# Patient Record
Sex: Female | Born: 1995
Health system: Southern US, Community
[De-identification: ages and names within clinical notes are randomized; demographics above are authoritative.]

## PROBLEM LIST (undated history)

## (undated) DIAGNOSIS — T7840XA Allergy, unspecified, initial encounter: Secondary | ICD-10-CM

## (undated) DIAGNOSIS — J45909 Unspecified asthma, uncomplicated: Secondary | ICD-10-CM

## (undated) DIAGNOSIS — K219 Gastro-esophageal reflux disease without esophagitis: Secondary | ICD-10-CM

## (undated) HISTORY — PX: NO PAST SURGERIES: SHX2092

## (undated) HISTORY — DX: Allergy, unspecified, initial encounter: T78.40XA

## (undated) HISTORY — DX: Unspecified asthma, uncomplicated: J45.909

## (undated) HISTORY — DX: Gastro-esophageal reflux disease without esophagitis: K21.9

---
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
## 2016-03-28 ENCOUNTER — Ambulatory Visit (INDEPENDENT_AMBULATORY_CARE_PROVIDER_SITE_OTHER): Payer: 59 | Admitting: Internal Medicine

## 2016-03-28 ENCOUNTER — Encounter: Payer: Self-pay | Admitting: Internal Medicine

## 2016-03-28 VITALS — BP 128/70 | HR 86 | Temp 98.0°F | Resp 16 | Ht 64.0 in | Wt 170.0 lb

## 2016-03-28 DIAGNOSIS — Z79899 Other long term (current) drug therapy: Secondary | ICD-10-CM | POA: Diagnosis not present

## 2016-03-28 DIAGNOSIS — L03032 Cellulitis of left toe: Secondary | ICD-10-CM

## 2016-03-28 DIAGNOSIS — E282 Polycystic ovarian syndrome: Secondary | ICD-10-CM

## 2016-03-28 LAB — CBC WITH DIFFERENTIAL/PLATELET
Basophils Absolute: 0 cells/uL (ref 0–200)
Basophils Relative: 0 %
EOS PCT: 1 %
Eosinophils Absolute: 75 cells/uL (ref 15–500)
HEMATOCRIT: 44.4 % (ref 35.0–45.0)
HEMOGLOBIN: 14.7 g/dL (ref 11.7–15.5)
LYMPHS ABS: 1500 {cells}/uL (ref 850–3900)
Lymphocytes Relative: 20 %
MCH: 30.1 pg (ref 27.0–33.0)
MCHC: 33.1 g/dL (ref 32.0–36.0)
MCV: 90.8 fL (ref 80.0–100.0)
MONO ABS: 375 {cells}/uL (ref 200–950)
MPV: 8.3 fL (ref 7.5–12.5)
Monocytes Relative: 5 %
NEUTROS ABS: 5550 {cells}/uL (ref 1500–7800)
Neutrophils Relative %: 74 %
Platelets: 294 10*3/uL (ref 140–400)
RBC: 4.89 MIL/uL (ref 3.80–5.10)
RDW: 12.9 % (ref 11.0–15.0)
WBC: 7.5 10*3/uL (ref 3.8–10.8)

## 2016-03-28 LAB — COMPREHENSIVE METABOLIC PANEL
ALBUMIN: 4.4 g/dL (ref 3.6–5.1)
ALK PHOS: 66 U/L (ref 47–176)
ALT: 12 U/L (ref 5–32)
AST: 15 U/L (ref 12–32)
BILIRUBIN TOTAL: 0.5 mg/dL (ref 0.2–1.1)
BUN: 9 mg/dL (ref 7–20)
CALCIUM: 9.8 mg/dL (ref 8.9–10.4)
CO2: 24 mmol/L (ref 20–31)
CREATININE: 0.81 mg/dL (ref 0.50–1.00)
Chloride: 106 mmol/L (ref 98–110)
Glucose, Bld: 83 mg/dL (ref 65–99)
Potassium: 4.5 mmol/L (ref 3.8–5.1)
SODIUM: 142 mmol/L (ref 135–146)
TOTAL PROTEIN: 7.4 g/dL (ref 6.3–8.2)

## 2016-03-28 LAB — TSH: TSH: 1.71 m[IU]/L (ref 0.50–4.30)

## 2016-03-28 NOTE — Progress Notes (Signed)
Subjective:    Patient ID: Katrina Baker, female    DOB: 1995-11-09, 20 y.o.   MRN: [id number]  HPI  Patient presents to the office for evaluation of toenail pain.  She reports that in April she stubbed her toe on a washing machine.  She reports that it split the nail and it split all the way down.  She reports that she did see a podiatrist and they took the nail off and then about a week ago she stubbed it again and got a small infection.  She was placed on keflex.  She has 3 days of the antibiotic left.  She reports it is getting better. She has no drainage.  She feels like there was some original swelling and redness.  This has improved some.    She reports that she does want to see somebody over at [facility] endocrinology for her PCOS.  She was told that they needed some labwork done here first.  They need a referral from [place] to get her set up.     Review of Systems  Constitutional: Negative for chills and fever.  HENT: Negative for congestion, ear pain and sore throat.   Eyes: Negative.   Respiratory: Negative for cough, shortness of breath and wheezing.   Cardiovascular: Negative for chest pain, palpitations and leg swelling.  Gastrointestinal: Negative for abdominal pain, blood in stool, constipation and diarrhea.  Genitourinary: Negative.   Skin: Positive for wound.  Neurological: Negative for dizziness and headaches.  Psychiatric/Behavioral: The patient is not nervous/anxious.        Objective:   Physical Exam  Constitutional: She is oriented to person, place, and time. She appears well-developed and well-nourished.  HENT:  Head: Normocephalic.  Mouth/Throat: Oropharynx is clear and moist. No oropharyngeal exudate.  Eyes: Conjunctivae and EOM are normal. Pupils are equal, round, and reactive to light. No scleral icterus.  Neck: Normal range of motion. Neck supple. No JVD present. No thyromegaly present.  Cardiovascular: Normal rate, regular rhythm and intact distal  pulses.  Exam reveals no gallop and no friction rub.   No murmur heard. Pulmonary/Chest: Effort normal and breath sounds normal. No respiratory distress. She has no wheezes. She has no rales. She exhibits no tenderness.  Abdominal: Soft. Bowel sounds are normal. She exhibits no distension and no mass. There is no tenderness. There is no rebound and no guarding.  Musculoskeletal: Normal range of motion.  Lymphadenopathy:    She has no cervical adenopathy.  Neurological: She is alert and oriented to person, place, and time. No cranial nerve deficit. Coordination normal.  Skin: Skin is warm and dry.  Left great toe with mild lateral redness at the edge of the nail with no subungal swelling or redness.  Non-tender to palpation.    Psychiatric: She has a normal mood and affect. Her behavior is normal. Judgment and thought content normal.  Nursing note and vitals reviewed.   Vitals:   03/28/16 1129  BP: 128/70  Pulse: 86  Resp: 16  Temp: 98 F (36.7 C)           Assessment & Plan:    1. PCOS (polycystic ovarian syndrome) -referral to endocrinology per obgyn recommendations - Hemoglobin [id number] - TSH - Follicle Stimulating Hormone - Luteinizing hormone - Prolactin - Estrogens, Total - Testosterone - Ambulatory referral to Endocrinology  2. Medication management  - CBC with Differential/Platelet - Comprehensive metabolic panel  3. Paronychia of great toe, left -well healing -warm water soaks -pull  skin back on nail edge -finish keflex

## 2016-03-29 LAB — TESTOSTERONE: Testosterone: 49 ng/dL

## 2016-03-29 LAB — HEMOGLOBIN A1C
Hgb A1c MFr Bld: 5 % (ref <5.7)
Mean Plasma Glucose: 97 mg/dL

## 2016-03-29 LAB — LUTEINIZING HORMONE: LH: 1.8 m[IU]/mL

## 2016-03-29 LAB — FOLLICLE STIMULATING HORMONE: FSH: 5.7 m[IU]/mL

## 2016-03-29 LAB — PROLACTIN: Prolactin: 5.5 ng/mL

## 2016-03-30 ENCOUNTER — Ambulatory Visit (INDEPENDENT_AMBULATORY_CARE_PROVIDER_SITE_OTHER): Payer: 59 | Admitting: Internal Medicine

## 2016-03-30 VITALS — BP 114/72 | HR 88 | Temp 98.2°F | Resp 16

## 2016-03-30 DIAGNOSIS — R55 Syncope and collapse: Secondary | ICD-10-CM | POA: Diagnosis not present

## 2016-03-30 NOTE — Patient Instructions (Signed)
Holter Monitoring A Holter monitor is a small device that is used to detect abnormal heart rhythms. It clips to your clothing and is connected by wires to flat, sticky disks (electrodes) that attach to your chest. It is worn continuously for 24-48 hours. HOME CARE INSTRUCTIONS  Wear your Holter monitor at all times, even while exercising and sleeping, for as long as directed by your health care provider.  Make sure that the Holter monitor is safely clipped to your clothing or close to your body as recommended by your health care provider.  Do not get the monitor or wires wet.  Do not put body lotion or moisturizer on your chest.  Keep your skin clean.  Keep a diary of your daily activities, such as walking and doing chores. If you feel that your heartbeat is abnormal or that your heart is fluttering or skipping a beat:  Record what you are doing when it happens.  Record what time of day the symptoms occur.  Return your Holter monitor as directed by your health care provider.  Keep all follow-up visits as directed by your health care provider. This is important. SEEK IMMEDIATE MEDICAL CARE IF:  You feel lightheaded or you faint.  You have trouble breathing.  You feel pain in your chest, upper arm, or jaw.  You feel sick to your stomach and your skin is pale, cool, or damp.  You heartbeat feels unusual or abnormal.   This information is not intended to replace advice given to you by your health care provider. Make sure you discuss any questions you have with your health care provider.   Document Released: 02/26/2004 Document Revised: 06/20/2014 Document Reviewed: 01/06/2014 Elsevier Interactive Patient Education 2016 [employer].  Syncope, commonly known as fainting, is a temporary loss of consciousness. It occurs when the blood flow to the brain is reduced. Vasovagal syncope (also called neurocardiogenic syncope) is a fainting spell in which the blood flow to the brain is  reduced because of a sudden drop in heart rate and blood pressure. Vasovagal syncope occurs when the brain and the cardiovascular system (blood vessels) do not adequately communicate and respond to each other. This is the most common cause of fainting. It often occurs in response to fear or some other type of emotional or physical stress. The body has a reaction in which the heart starts beating too slowly or the blood vessels expand, reducing blood pressure. This type of fainting spell is generally considered harmless. However, injuries can occur if a person takes a sudden fall during a fainting spell.  CAUSES  Vasovagal syncope occurs when a person's blood pressure and heart rate decrease suddenly, usually in response to a trigger. Many things and situations can trigger an episode. Some of these include:   Pain.   Fear.   The sight of blood or medical procedures, such as blood being drawn from a vein.   Common activities, such as coughing, swallowing, stretching, or going to the bathroom.   Emotional stress.   Prolonged standing, especially in a warm environment.   Lack of sleep or rest.   Prolonged lack of food.   Prolonged lack of fluids.   Recent illness.  The use of certain drugs that affect blood pressure, such as cocaine, alcohol, marijuana, inhalants, and opiates.  SYMPTOMS  Before the fainting episode, you may:   Feel dizzy or light headed.   Become pale.  Sense that you are going to faint.   Feel like the room  is spinning.   Have tunnel vision, only seeing directly in front of you.   Feel sick to your stomach (nauseous).   See spots or slowly lose vision.   Hear ringing in your ears.   Have a headache.   Feel warm and sweaty.   Feel a sensation of pins and needles. During the fainting spell, you will generally be unconscious for no longer than a couple minutes before waking up and returning to normal. If you get up too quickly before your  body can recover, you may faint again. Some twitching or jerky movements may occur during the fainting spell.  DIAGNOSIS  Your health care provider will ask about your symptoms, take a medical history, and perform a physical exam. Various tests may be done to rule out other causes of fainting. These may include blood tests and tests to check the heart, such as electrocardiography, echocardiography, and possibly an electrophysiology study. When other causes have been ruled out, a test may be done to check the body's response to changes in position (tilt table test). TREATMENT  Most cases of vasovagal syncope do not require treatment. Your health care provider may recommend ways to avoid fainting triggers and may provide home strategies for preventing fainting. If you must be exposed to a possible trigger, you can drink additional fluids to help reduce your chances of having an episode of vasovagal syncope. If you have warning signs of an oncoming episode, you can respond by positioning yourself favorably (lying down). If your fainting spells continue, you may be given medicines to prevent fainting. Some medicines may help make you more resistant to repeated episodes of vasovagal syncope. Special exercises or compression stockings may be recommended. In rare cases, the surgical placement of a pacemaker is considered. HOME CARE INSTRUCTIONS   Learn to identify the warning signs of vasovagal syncope.   Sit or lie down at the first warning sign of a fainting spell. If sitting, put your head down between your legs. If you lie down, swing your legs up in the air to increase blood flow to the brain.   Avoid hot tubs and saunas.  Avoid prolonged standing.  Drink enough fluids to keep your urine clear or pale yellow. Avoid caffeine.  Increase salt in your diet as directed by your health care provider.   If you have to stand for a long time, perform movements such as:   Crossing your legs.    Flexing and stretching your leg muscles.   Squatting.   Moving your legs.   Bending over.   Only take over-the-counter or prescription medicines as directed by your health care provider. Do not suddenly stop any medicines without asking your health care provider first. [place] IF:   Your fainting spells continue or happen more frequently in spite of treatment.   You lose consciousness for more than a couple minutes.  You have fainting spells during or after exercising or after being startled.   You have new symptoms that occur with the fainting spells, such as:   Shortness of breath.  Chest pain.   Irregular heartbeat.   You have episodes of twitching or jerky movements that last longer than a few seconds.  You have episodes of twitching or jerky movements without obvious fainting. SEEK IMMEDIATE MEDICAL CARE IF:   You have injuries or bleeding after a fainting spell.   You have episodes of twitching or jerky movements that last longer than 5 minutes.   You have  more than one spell of twitching or jerky movements before returning to consciousness after fainting.   This information is not intended to replace advice given to you by your health care provider. Make sure you discuss any questions you have with your health care provider.   Document Released: 05/16/2012 Document Revised: 10/14/2014 Document Reviewed: 05/16/2012 Elsevier Interactive Patient Education [employer].

## 2016-03-30 NOTE — Progress Notes (Signed)
Subjective:    Patient ID: Katrina Baker, female    DOB: [birth year], 20 y.o.   MRN: [id number]  HPI  Patient presents to the office for evaluation of a syncopal episode that occurred today at work.  Patient reports that this happened today when she was at work.  She reports that she was holding up a resident and she developed some lightheadedness.  She then squatted down.  She reports that this was witnessed by an LPN and was told that she had her eyes roll back and she fell backward.  She reports that she was out for 10 seconds.  The nurses checked her vitals sitting and standing and blood sugar too and her sugar was 94.  She reports that she was holding the patient for approximately 10 minutes.  She did not have knees locked out when she was standing.  She has passed out several times before.  She reports that this happened several years ago. She was told that it was a pain induced seizure.  She reports that she has seen a pediatric cardiologist.  She reports that she felt fine this morning.  She ate breakfast did her normal morning routine.    Review of Systems  Constitutional: Negative for chills and fever.  HENT: Negative for congestion, ear pain, postnasal drip and sore throat.   Eyes: Positive for visual disturbance (Blurry vision).  Respiratory: Negative for chest tightness and shortness of breath.   Cardiovascular: Negative for chest pain, palpitations and leg swelling.  Gastrointestinal: Negative for abdominal pain, blood in stool, constipation, diarrhea, nausea and vomiting.  Neurological: Positive for light-headedness and headaches. Negative for dizziness, weakness and numbness.  Psychiatric/Behavioral: Negative for agitation, decreased concentration and suicidal ideas. The patient is not nervous/anxious.        Objective:   Physical Exam  Constitutional: She is oriented to person, place, and time. She appears well-developed and well-nourished. No distress.  HENT:  Head:  Normocephalic.  Mouth/Throat: Oropharynx is clear and moist. No oropharyngeal exudate.  Small palpable occipital hematoma without laceration or bleeding.  Mildly tender to palpation.    Eyes: Conjunctivae and EOM are normal. Pupils are equal, round, and reactive to light. No scleral icterus.  Neck: Normal range of motion. Neck supple. No JVD present. No thyromegaly present.  Cardiovascular: Normal rate, regular rhythm, normal heart sounds and intact distal pulses.  Exam reveals no gallop and no friction rub.   No murmur heard. Pulmonary/Chest: Effort normal and breath sounds normal. No respiratory distress. She has no wheezes. She has no rales. She exhibits no tenderness.  Abdominal: Soft. Bowel sounds are normal. She exhibits no distension and no mass. There is no tenderness. There is no rebound and no guarding.  Musculoskeletal: Normal range of motion.       Cervical back: Normal.       Thoracic back: Normal.       Lumbar back: Normal.  Lymphadenopathy:    She has no cervical adenopathy.  Neurological: She is alert and oriented to person, place, and time. No cranial nerve deficit. Coordination normal.  Skin: Skin is warm and dry. She is not diaphoretic.  Psychiatric: She has a normal mood and affect. Her behavior is normal. Judgment and thought content normal.  Nursing note and vitals reviewed.   Vitals:   03/30/16 1122  BP: 114/72  Pulse: 88  Resp: 16  Temp: 98.2 F (36.8 C)          Assessment & Plan:  1. Syncope and collapse -EKG normal but no prior to compare to.  No delta waves, no ST changes, no evidence of arrhythmia -there is family history of POTs and will refer patient to see cards for possible holter monitor.  She has had normal echo in the past.   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  Patient appears clinically well hydrated -no further testing will be done today -work note given -patient to go to ER if chest pains, palpitations or any other concerning symptoms.  She and mother both agree to this.   - Ambulatory referral to Cardiology

## 2016-03-31 LAB — ESTROGENS, TOTAL: Estrogen: 129.4 pg/mL

## 2016-04-02 NOTE — Addendum Note (Signed)
Addended by: Charie Pinkus A on: 04/02/2016 06:21 PM   Modules accepted: Orders

## 2016-05-30 ENCOUNTER — Ambulatory Visit: Payer: PRIVATE HEALTH INSURANCE | Admitting: Cardiovascular Disease

## 2016-05-30 ENCOUNTER — Ambulatory Visit (INDEPENDENT_AMBULATORY_CARE_PROVIDER_SITE_OTHER): Payer: PRIVATE HEALTH INSURANCE | Admitting: Internal Medicine

## 2016-05-30 ENCOUNTER — Encounter: Payer: Self-pay | Admitting: Internal Medicine

## 2016-05-30 DIAGNOSIS — E282 Polycystic ovarian syndrome: Secondary | ICD-10-CM | POA: Diagnosis not present

## 2016-05-30 MED ORDER — METFORMIN HCL 500 MG PO TABS: 500.0000 mg | ORAL_TABLET | Freq: Two times a day (BID) | ORAL | 3 refills | Status: DC

## 2016-05-30 MED ORDER — SPIRONOLACTONE 50 MG PO TABS: 50.0000 mg | ORAL_TABLET | Freq: Two times a day (BID) | ORAL | 11 refills | Status: DC

## 2016-05-30 NOTE — Patient Instructions (Addendum)
Please continue your birth control pill.  Start Spironolactone 25 mg 2x a day and increase to 50 mg 2x a day after ~3 days. Come back for a nurse appt and labs in 1 week.  After next set of labs, start Metformin 500 mg 1x a day (with dinner) for 4 days, then increase to 500 mg 2x a day.  Please look up the following website: [email or web address]  Please see Ozzie Hoyle (nutritionist).   Polycystic Ovarian Syndrome Polycystic ovarian syndrome (PCOS) is a common hormonal disorder among women of reproductive age. In most women with PCOS, many small fluid-filled sacs (cysts) grow on the ovaries, and the cysts are not part of a normal menstrual cycle. PCOS can cause problems with your menstrual periods and make it difficult to get pregnant. It can also cause an increased risk of miscarriage with pregnancy. If it is not treated, PCOS can lead to serious health problems, such as diabetes and heart disease. What are the causes? The cause of PCOS is not known, but it may be the result of a combination of certain factors, such as:  Irregular menstrual cycle.  High levels of certain hormones (androgens).  Problems with the hormone that helps to control blood sugar (insulin resistance).  Certain genes. What increases the risk? This condition is more likely to develop in women who have a family history of PCOS. What are the signs or symptoms? Symptoms of PCOS may include:  Multiple ovarian cysts.  Infrequent periods or no periods.  Periods that are too frequent or too heavy.  Unpredictable periods.  Inability to get pregnant (infertility) because of not ovulating.  Increased growth of hair on the face, chest, stomach, back, thumbs, thighs, or toes.  Acne or oily skin. Acne may develop during adulthood, and it may not respond to treatment.  Pelvic pain.  Weight gain or obesity.  Patches of thickened and dark brown or black skin on the neck, arms, breasts, or thighs (acanthosis  nigricans).  Excess hair growth on the face, chest, abdomen, or upper thighs (hirsutism). How is this diagnosed? This condition is diagnosed based on:  Your medical history.  A physical exam, including a pelvic exam. Your health care provider may look for areas of increased hair growth on your skin.  Tests, such as:  Ultrasound. This may be used to examine the ovaries and the lining of the uterus (endometrium) for cysts.  Blood tests. These may be used to check levels of sugar (glucose), female hormone (testosterone), and female hormones (estrogen and progesterone) in your blood. How is this treated? There is no cure for PCOS, but treatment can help to manage symptoms and prevent more health problems from developing. Treatment varies depending on:  Your symptoms.  Whether you want to have a baby or whether you need birth control (contraception). Treatment may include nutrition and lifestyle changes along with:  Progesterone hormone to start a menstrual period.  Birth control pills to help you have regular menstrual periods.  Medicines to make you ovulate, if you want to get pregnant.  Medicine to reduce excessive hair growth.  Surgery, in severe cases. This may involve making small holes in one or both of your ovaries. This decreases the amount of testosterone that your body produces. Follow these instructions at home:  Take over-the-counter and prescription medicines only as told by your health care provider.  Follow a healthy meal plan. This can help you reduce the effects of PCOS.  Eat a healthy diet that includes lean  proteins, complex carbohydrates, fresh fruits and vegetables, low-fat dairy products, and healthy fats. Make sure to eat enough fiber.  If you are overweight, lose weight as told by your health care provider.  Losing 10% of your body weight may improve symptoms.  Your health care provider can determine how much weight loss is best for you and can help you  lose weight safely.  Keep all follow-up visits as told by your health care provider. This is important. Contact a health care provider if:  Your symptoms do not get better with medicine.  You develop new symptoms. This information is not intended to replace advice given to you by your health care provider. Make sure you discuss any questions you have with your health care provider. Document Released: 09/23/2004 Document Revised: 01/26/2016 Document Reviewed: 11/15/2015 Elsevier Interactive Patient Education  2017 [employer].

## 2016-05-30 NOTE — Progress Notes (Signed)
Patient ID: Katrina Baker, female   DOB: 03-Mar-1996, 20 y.o.   MRN: [id number]   HPI: Katrina Baker is a 20 y.o. female, referred by Dr. Melford Aase, for management of PCOS. She is here with her mother who offers part of the history.  Fertility/Menstrual cycles: - menarche at 20 y/o - irregular menses since menarche - dx'ed with PCOS at 20 y/o >> started OCP - then changed to [facility] >> tolerates this well - no h/o ovarian cysts - children: 0 - miscarriages: 0 - contraception: OCPs  Acne: - had acne, but now on topical creams - Retin-A + another component - only occasional breakouts  Hirsutism: - on her body: back of neck, back, chest, stomach - her biggest concern is the upper back, however, her mother tells me that she was born with a whirl of hair on the upper back and it never really disappeared  Weight gain: - she can lose 8-10 lbs then gain them back - no steroid use - no weight loss meds - Meals: - Breakfast: fruit, oatmeal or cereal - Lunch: meat + veggies + bread + fruit - Dinner: meat + veggies + bread + fruit - Snacks: rare Drinks: sodas - milk - Diets tried: cut out carbs - Exercise: cardio 30 min - 1h, strength training - 3-5x a week  Treatments tried: - did not try Metformin - did not try Spironolactone - did not try Vaniqua - on OCPs  - last testosterone level: 03/28/2016: Normal total testosterone, while on OCPs.  - Last thyroid tests: Lab Results  Component Value Date   TSH 1.71 03/28/2016   - Last HbA1c: Lab Results  Component Value Date   HGBA1C 5.0 03/28/2016   She has FH of PCOS in paternal aunt and cousins.  She has FH of DM.  She had several instances of passing out - every 3 years. She will be investigated by cardiology.  ROS: Constitutional: + weight gain, no fatigue, no subjective hyperthermia/hypothermia Eyes: no blurry vision, no xerophthalmia ENT: no sore throat, no nodules palpated in throat, no dysphagia/odynophagia, no  hoarseness Cardiovascular: no CP/SOB/palpitations/leg swelling Respiratory: no cough/SOB Gastrointestinal: no N/V/D/C Musculoskeletal: no muscle/joint aches Skin: + acne, + hair on face, upper back, abdomen Neurological: no tremors/numbness/tingling/dizziness Psychiatric: no depression/anxiety  Past Medical History:  Diagnosis Date  . Allergy    No past surgical history on file. Social History   Social History  . Marital status: Single    Spouse name: N/A  . Number of children: 0   Occupational History  . 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 Smoking status: Never Smoker  . Smokeless tobacco: Not on file  . Alcohol use No  . Drug use: No  . Sexual activity: Not Currently    Birth control/ protection: Pill   Current Outpatient Prescriptions on File Prior to Visit  Medication Sig Dispense Refill  . Levonorgestrel-Ethinyl Estrad (PORTIA-28 PO) Take by mouth daily.    . naproxen (NAPROSYN) 500 MG tablet Take 500 mg by mouth daily as needed.    . tretinoin (RETIN-A) 0.05 % cream Apply topically at bedtime. Apply pea sized amount at bedtime every other day. 45 g 0   No current facility-administered medications on file prior to visit.    No Known Allergies Family History  Problem Relation Age of Onset  . Cancer Paternal Grandmother   . Cancer Maternal Aunt   . Heart  Problems Mother   . Diabetes Father   . High blood pressure Father   . High Cholesterol Father    PE: BP 124/74   Pulse 86   Ht 5\' 4"  (1.626 m)   Wt 167 lb 9.6 oz (76 kg)   SpO2 98%   BMI 28.77 kg/m  Wt Readings from Last 3 Encounters:  05/30/16 167 lb 9.6 oz (76 kg) (91 %, Z= 1.36)*  03/28/16 170 lb (77.1 kg) (92 %, Z= 1.42)*  09/23/15 173 lb (78.5 kg) (94 %, Z= 1.52)*   * Growth percentiles are based on CDC 2-20 Years data.   Constitutional: overweight, in NAD, no full supraclavicular fat pads Eyes: PERRLA, EOMI, no exophthalmos ENT: moist mucous  membranes, no thyromegaly, no cervical lymphadenopathy Cardiovascular: RRR, No MRG Respiratory: CTA B Gastrointestinal: abdomen soft, NT, ND, BS+ Musculoskeletal: no deformities, strength intact in all 4 Skin: moist, warm; + acne on Anterior Neck, No dark terminal hair on chin, no vellum on sideburns, no skin tags, no acanthosis nigricans, no purple, wide, stretch marks. She does have lanugo on upper back Neurological: no tremor with outstretched hands, DTR normal in all 4  ASSESSMENT: 1. PCOS  PLAN: 1.  I had a long discussion with the patient and her mother about the fact that the PCOS is a misnomer, a patient does not necessarily have to have polycystic ovaries to be diagnosed with the disorder. This is of sum of several conditions, including:  weight gain  insulin resistance (and therefore a higher risk of developing diabetes later in life)  acne  hirsutism  irregular menstrual cycles  decreased fertility. - We also discussed about the fact that the treatment is usually targeted to addressing the problem that concerns the patient the most: acne/hirsutism, weight gain, or fertility, but there is no single treatment for PCOS.  - The first-line therapy are oral contraceptives. If she is concerned with her weight, we can use metformin; if she is concerned about acne/hirsutism, we can add spironolactone; and if she is concerned about fertility, I could refer her to reproductive endocrinology for possible use of clomiphene. - She is already on OCPs, which have greatly help with her menstrual irregularity. She feels well on them and her recent testosterone level was normal. She is bothered by her unwanted hair, especially on her back (however, per mom's description, this appears to be unrelated to PCOS), but also on abdomen. She is also bothered by her weight, and especially the difficulty keeping the weight off. - We discussed about adding spironolactone to help with the unwanted hair and  we will start at the low dose, 25 mg twice a day and advance to 50 mg twice a day in 3 days. I will then have the patient comfort nurse appointment for blood pressure check and a potassium check in a week. - Will also add metformin at 500 mg daily and increase to 500 mg twice a day. I explained that this diabetes medication is used off label for PCOS for weight control. We reviewed together her latest HbA1c, which is great, at 5.0%. I will repeat this in next visit. I also suggested a referral to nutrition, with which she agrees. - At next visit, she will also need a CMP and lipid panel, if not drawn by then - I'll see the patient back in 6 months  Philemon Kingdom, MD PhD [facility] Endocrinology

## 2016-05-31 ENCOUNTER — Encounter: Payer: Self-pay | Admitting: Internal Medicine

## 2016-06-08 ENCOUNTER — Other Ambulatory Visit: Payer: Self-pay

## 2016-06-16 ENCOUNTER — Ambulatory Visit (INDEPENDENT_AMBULATORY_CARE_PROVIDER_SITE_OTHER): Payer: 59 | Admitting: Cardiovascular Disease

## 2016-06-16 ENCOUNTER — Encounter: Payer: Self-pay | Admitting: Cardiovascular Disease

## 2016-06-16 VITALS — BP 100/58 | HR 82 | Ht 64.0 in | Wt 168.0 lb

## 2016-06-16 DIAGNOSIS — R55 Syncope and collapse: Secondary | ICD-10-CM | POA: Diagnosis not present

## 2016-06-16 HISTORY — DX: Syncope and collapse: R55

## 2016-06-16 NOTE — Progress Notes (Signed)
Cardiology Consultation Note    Date:  06/16/2016   ID:  BERENIZE BARTHA, DOB 09-15-1995, MRN [id number]  PCP:  Alesia Richards, MD  Cardiologist:   Sanda Klein, MD   Reason for consultation   Patient presents with  . Recurrent syncope, sharp bilateral chest pain        History of Present Illness:  Katrina Baker is a 21 y.o. female without known structural cardiac disease, here for evaluation of 4-5 episodes of syncope that have occurred roughly once every 3 years since middle school. The episodes have a peculiar pattern of always happening in the late fall.  All the episodes have a similar pattern. They all have a fairly lengthy prodrome with blurry vision and mild disorientation. In childhood they were consistently triggered by pain, for example after falling. The most recent one which happened in October occurred when she had been standing immobile for about 30-40 minutes, supporting a nursing home resident. Usually she is unconscious for about a minute, at most 2 or 3 minutes. During one episode her mother witnessed her stiffening and making some unusual guttural sounds. True convulsions have not been witnessed. She has not lost sphincter tone control. She is quickly reoriented after regaining consciousness. She reports undergoing neurological evaluation including a sleep deprived EEG with negative results. On one occasion she did have a minor scalp laceration after falling, but usually her syncopal events have not been associated with true injury. She denies palpitations.  Outside of the episodes of syncope she feels well and denies angina or dyspnea, palpitations, edema, claudication, focal neurological complaints. She has occasional sharp twinges on either side of her chest which sound convincingly musculoskeletal She sees an endocrinologist for PCOS and secondary hirsutism/acne problems. She does not have diabetes mellitus. She tends to have a low normal blood  pressure.  The mother has been diagnosed with POTS. Her father has systemic hypertension, which he developed in his 49s. Otherwise there is no significant past medical history.   Past Medical History:  Diagnosis Date  . Allergy     No past surgical history on file.  Current Medications: Outpatient Medications Prior to Visit  Medication Sig Dispense Refill  . Levonorgestrel-Ethinyl Estrad (PORTIA-28 PO) Take by mouth daily.    . metFORMIN (GLUCOPHAGE) 500 MG tablet Take 1 tablet (500 mg total) by mouth 2 (two) times daily with a meal. 180 tablet 3  . naproxen (NAPROSYN) 500 MG tablet Take 500 mg by mouth daily as needed.    . tretinoin (RETIN-A) 0.05 % cream Apply topically at bedtime. Apply pea sized amount at bedtime every other day. 45 g 0  . spironolactone (ALDACTONE) 50 MG tablet Take 1 tablet (50 mg total) by mouth 2 (two) times daily. (Patient not taking: Reported on 06/16/2016) 60 tablet 11   No facility-administered medications prior to visit.      Allergies:   Patient has no known allergies.   Social History   Social History  . Marital status: Single    Spouse name: N/A  . Number of children: N/A  . Years of education: N/A   Social History Main Topics  . Smoking status: Never Smoker  . Smokeless tobacco: None  . Alcohol use No  . Drug use: No  . Sexual activity: Not Currently    Birth control/ protection: Pill   Other Topics Concern  . None   Social History Narrative  . None     Family History:  The patient's family  history includes COPD in her maternal grandmother; Cancer in her maternal aunt and paternal grandmother; Diabetes in her father and maternal grandfather; Heart Problems in her mother; High Cholesterol in her father; High blood pressure in her father; Hypertension in her maternal grandfather.   ROS:   Please see the history of present illness.    ROS All other systems reviewed and are negative.   PHYSICAL EXAM:   VS:  BP (!) 100/58 (BP  Location: Left Arm, Patient Position: Sitting, Cuff Size: Normal)   Pulse 82   Ht 5\' 4"  (1.626 m)   Wt 168 lb (76.2 kg)   BMI 28.84 kg/m    GEN: Well nourished, well developed, in no acute distress  HEENT: normal  Neck: no JVD, carotid bruits, or masses Cardiac: RRR; no murmurs, rubs, or gallops,no edema  Respiratory:  clear to auscultation bilaterally, normal work of breathing GI: soft, nontender, nondistended, + BS MS: no deformity or atrophy  Skin: warm and dry, no rash Neuro:  Alert and Oriented x 3, Strength and sensation are intact Psych: euthymic mood, full affect  Wt Readings from Last 3 Encounters:  06/16/16 168 lb (76.2 kg) (91 %, Z= 1.37)*  05/30/16 167 lb 9.6 oz (76 kg) (91 %, Z= 1.36)*  03/28/16 170 lb (77.1 kg) (92 %, Z= 1.42)*   * Growth percentiles are based on CDC 2-20 Years data.      Studies/Labs Reviewed:   EKG:  EKG is ordered today.  The ekg ordered today demonstrates NSR, normal tracing. Is no evidence of preexcitation. The QT interval is normal. There are no repolarization abnormalities.  Recent Labs: 03/28/2016: ALT 12; BUN 9; Creat 0.81; Hemoglobin 14.7; Platelets 294; Potassium 4.5; Sodium 142; TSH 1.71    Additional studies/ records that were reviewed today include:  ER records 2012 and ECG from March 28, 2016    ASSESSMENT:    1. Syncope, unspecified syncope type      PLAN:  In order of problems listed above:  1. Syncope: Her episodes of loss of consciousness are highly suggestive of new early mediated syncope. She has a significant and repetitive prodrome and quick recovery. We'll perform an echocardiogram to make sure there are no underlying structural cardiac abnormalities, but I strongly doubt arrhythmic syncope based on her history. Discussed the importance of avoiding known triggers, always staying well hydrated, eating a relatively high salt diet, avoiding natural diuretics such as caffeine and alcohol, avoiding prolonged  orthostasis without moving, promptly assuming a supine position at the onset of prodromal symptoms. Reviewed the generally benign natural history of this disorder, also the fact that it is likely to be a lifelong condition. We'll call her with the results of the echo.    Medication Adjustments/Labs and Tests Ordered: Current medicines are reviewed at length with the patient today.  Concerns regarding medicines are outlined above.  Medication changes, Labs and Tests ordered today are listed in the Patient Instructions below. Patient Instructions  Neurally Mediated Syncope  Your physician has requested that you have an echocardiogram. Echocardiography is a painless test that uses sound waves to create images of your heart. It provides your doctor with information about the size and shape of your heart and how well your heart's chambers and valves are working. This procedure takes approximately one hour. There are no restrictions for this procedure. This will be performed at our [facility] location - [address].  Dr Sallyanne Kuster recommends that you schedule a follow-up  appointment in 1 year. You will receive a reminder letter in the mail two months in advance. If you don't receive a letter, please call our office to schedule the follow-up appointment.    Syncope Syncope is when you temporarily lose consciousness. Syncope may also be called fainting or passing out. It is caused by a sudden decrease in blood flow to the brain. Even though most causes of syncope are not dangerous, syncope can be a sign of a serious medical problem. Signs that you may be about to faint include:  Feeling dizzy or light-headed.  Feeling nauseous.  Seeing all white or all black in your field of vision.  Having cold, clammy skin. If you fainted, get medical help right away.Call your local emergency services (911 in the [place] Do not drive yourself to the hospital. Follow these instructions at home: Pay  attention to any changes in your symptoms. Take these actions to help with your condition:  Have someone stay with you until you feel stable.  Do not drive, use machinery, or play sports until your health care provider says it is okay.  Keep all follow-up visits as told by your health care provider. This is important.  If you start to feel like you might faint, lie down right away and raise (elevate) your feet above the level of your heart. Breathe deeply and steadily. Wait until all of the symptoms have passed.  Drink enough fluid to keep your urine clear or pale yellow.  If you are taking blood pressure or heart medicine, get up slowly and take several minutes to sit and then stand. This can reduce dizziness.  Take over-the-counter and prescription medicines only as told by your health care provider. Get help right away if:  You have a severe headache.  You have unusual pain in your chest, abdomen, or back.  You are bleeding from your mouth or rectum, or you have black or tarry stool.  You have a very fast or irregular heartbeat (palpitations).  You have pain with breathing.  You faint once or repeatedly.  You have a seizure.  You are confused.  You have trouble walking.  You have severe weakness.  You have vision problems. These symptoms may represent a serious problem that is an emergency. Do not wait to see if your symptoms will go away. Get medical help right away. Call your local emergency services (911 in the [place] Do not drive yourself to the hospital.  This information is not intended to replace advice given to you by your health care provider. Make sure you discuss any questions you have with your health care provider. Document Released: 05/30/2005 Document Revised: 11/05/2015 Document Reviewed: 02/11/2015 Elsevier Interactive Patient Education  2017 [place], Sanda Klein, MD  06/16/2016 2:47 PM    [address], [place], [place]  [address] Phone: [phone]; Fax: [phone]

## 2016-06-16 NOTE — Patient Instructions (Signed)
Neurally Mediated Syncope  Your physician has requested that you have an echocardiogram. Echocardiography is a painless test that uses sound waves to create images of your heart. It provides your doctor with information about the size and shape of your heart and how well your heart's chambers and valves are working. This procedure takes approximately one hour. There are no restrictions for this procedure. This will be performed at our [facility] location - [address].  Dr Sallyanne Kuster recommends that you schedule a follow-up appointment in 1 year. You will receive a reminder letter in the mail two months in advance. If you don't receive a letter, please call our office to schedule the follow-up appointment.    Syncope Syncope is when you temporarily lose consciousness. Syncope may also be called fainting or passing out. It is caused by a sudden decrease in blood flow to the brain. Even though most causes of syncope are not dangerous, syncope can be a sign of a serious medical problem. Signs that you may be about to faint include:  Feeling dizzy or light-headed.  Feeling nauseous.  Seeing all white or all black in your field of vision.  Having cold, clammy skin. If you fainted, get medical help right away.Call your local emergency services (911 in the [place] Do not drive yourself to the hospital. Follow these instructions at home: Pay attention to any changes in your symptoms. Take these actions to help with your condition:  Have someone stay with you until you feel stable.  Do not drive, use machinery, or play sports until your health care provider says it is okay.  Keep all follow-up visits as told by your health care provider. This is important.  If you start to feel like you might faint, lie down right away and raise (elevate) your feet above the level of your heart. Breathe deeply and steadily. Wait until all of the symptoms have passed.  Drink enough fluid to keep your  urine clear or pale yellow.  If you are taking blood pressure or heart medicine, get up slowly and take several minutes to sit and then stand. This can reduce dizziness.  Take over-the-counter and prescription medicines only as told by your health care provider. Get help right away if:  You have a severe headache.  You have unusual pain in your chest, abdomen, or back.  You are bleeding from your mouth or rectum, or you have black or tarry stool.  You have a very fast or irregular heartbeat (palpitations).  You have pain with breathing.  You faint once or repeatedly.  You have a seizure.  You are confused.  You have trouble walking.  You have severe weakness.  You have vision problems. These symptoms may represent a serious problem that is an emergency. Do not wait to see if your symptoms will go away. Get medical help right away. Call your local emergency services (911 in the [place] Do not drive yourself to the hospital.  This information is not intended to replace advice given to you by your health care provider. Make sure you discuss any questions you have with your health care provider. Document Released: 05/30/2005 Document Revised: 11/05/2015 Document Reviewed: 02/11/2015 Elsevier Interactive Patient Education  2017 [employer].

## 2016-06-21 ENCOUNTER — Ambulatory Visit: Payer: Self-pay | Admitting: *Deleted

## 2016-07-05 ENCOUNTER — Other Ambulatory Visit (HOSPITAL_COMMUNITY): Payer: 59

## 2016-07-07 ENCOUNTER — Ambulatory Visit: Payer: 59 | Admitting: *Deleted

## 2016-07-21 ENCOUNTER — Other Ambulatory Visit (HOSPITAL_COMMUNITY): Payer: 59

## 2016-07-22 ENCOUNTER — Encounter: Payer: Self-pay | Admitting: Internal Medicine

## 2016-07-22 ENCOUNTER — Ambulatory Visit (INDEPENDENT_AMBULATORY_CARE_PROVIDER_SITE_OTHER): Payer: 59 | Admitting: Internal Medicine

## 2016-07-22 VITALS — BP 106/58 | HR 100 | Temp 98.2°F | Ht 64.0 in | Wt 162.0 lb

## 2016-07-22 DIAGNOSIS — B348 Other viral infections of unspecified site: Secondary | ICD-10-CM | POA: Diagnosis not present

## 2016-07-22 MED ORDER — AZELASTINE HCL 0.1 % NA SOLN: 2.0000 | Freq: Two times a day (BID) | NASAL | 2 refills | Status: DC

## 2016-07-22 MED ORDER — PREDNISONE 20 MG PO TABS: ORAL_TABLET | ORAL | 0 refills | Status: DC

## 2016-07-22 MED ORDER — FLUTICASONE PROPIONATE 50 MCG/ACT NA SUSP: 2.0000 | Freq: Every day | NASAL | 0 refills | Status: DC

## 2016-07-22 NOTE — Addendum Note (Signed)
Addended by: Kelvis Berger A on: 07/22/2016 10:40 AM   Modules accepted: Orders

## 2016-07-22 NOTE — Addendum Note (Signed)
Addended by: Prajwal Fellner A on: 07/22/2016 10:38 AM   Modules accepted: Orders

## 2016-07-22 NOTE — Progress Notes (Signed)
HPI  Patient presents to the office for evaluation of congestion.  It has been going on for 3 weeks.  Patient reports night > day, dry, worse with lying down.  They also endorse change in voice, postnasal drip and yellow nasal congestion, sinus headaches, sore throat.  .  They have tried mucinex tylenol cold and flu..  They report that nothing has worked.  They denies other sick contacts.  Review of Systems  Constitutional: Positive for malaise/fatigue. Negative for chills and fever.  HENT: Positive for congestion, ear pain, hearing loss and sore throat.   Respiratory: Positive for cough. Negative for sputum production, shortness of breath and wheezing.   Cardiovascular: Negative for chest pain, palpitations and leg swelling.  Neurological: Positive for headaches.    PE:  Vitals:   07/22/16 0854  BP: (!) 106/58  Pulse: 100  Temp: 98.2 F (36.8 C)    General:  Alert and non-toxic, WDWN, NAD HEENT: NCAT, PERLA, EOM normal, no occular discharge or erythema.  Nasal mucosal edema with sinus tenderness to palpation.  Oropharynx clear with minimal oropharyngeal edema and erythema.  Mucous membranes moist and pink. Neck:  Cervical adenopathy Chest:  RRR no MRGs.  Lungs clear to auscultation A&P with no wheezes rhonchi or rales.   Abdomen: +BS x 4 quadrants, soft, non-tender, no guarding, rigidity, or rebound. Skin: warm and dry no rash Neuro: A&Ox4, CN II-XII grossly intact  Assessment and Plan:   1. Rhinovirus -prednisone -flonase -astelin -daily antihistamine

## 2016-07-27 ENCOUNTER — Encounter: Payer: Self-pay | Admitting: Cardiovascular Disease

## 2016-08-08 ENCOUNTER — Encounter: Payer: Self-pay | Admitting: Internal Medicine

## 2016-08-10 ENCOUNTER — Other Ambulatory Visit: Payer: Self-pay | Admitting: Internal Medicine

## 2016-08-10 MED ORDER — DAPSONE 7.5 % EX GEL: 1.0000 "application " | Freq: Every day | CUTANEOUS | 1 refills | Status: DC

## 2016-08-10 MED ORDER — TRETINOIN 0.05 % EX CREA
TOPICAL_CREAM | Freq: Every day | CUTANEOUS | 0 refills | Status: DC
Start: 2016-08-10 — End: 2016-08-11

## 2016-08-11 ENCOUNTER — Other Ambulatory Visit: Payer: Self-pay | Admitting: *Deleted

## 2016-08-11 MED ORDER — TRETINOIN 0.05 % EX CREA: TOPICAL_CREAM | Freq: Every day | CUTANEOUS | 3 refills | Status: DC

## 2016-09-07 ENCOUNTER — Encounter: Payer: Self-pay | Admitting: Internal Medicine

## 2016-09-07 ENCOUNTER — Other Ambulatory Visit: Payer: Self-pay | Admitting: Internal Medicine

## 2016-09-07 MED ORDER — TRETINOIN 0.05 % EX CREA: TOPICAL_CREAM | Freq: Every day | CUTANEOUS | 3 refills | Status: DC

## 2016-09-07 MED ORDER — DAPSONE 7.5 % EX GEL: 1.0000 "application " | Freq: Every day | CUTANEOUS | 1 refills | Status: DC

## 2016-10-06 ENCOUNTER — Ambulatory Visit (INDEPENDENT_AMBULATORY_CARE_PROVIDER_SITE_OTHER): Payer: 59 | Admitting: Physician Assistant

## 2016-10-06 ENCOUNTER — Encounter: Payer: Self-pay | Admitting: Physician Assistant

## 2016-10-06 VITALS — BP 110/68 | HR 77 | Temp 97.9°F | Resp 16 | Ht 64.0 in | Wt 165.2 lb

## 2016-10-06 DIAGNOSIS — L700 Acne vulgaris: Secondary | ICD-10-CM | POA: Diagnosis not present

## 2016-10-06 MED ORDER — MINOCYCLINE HCL 100 MG PO CAPS: 100.0000 mg | ORAL_CAPSULE | Freq: Every day | ORAL | 2 refills | Status: DC

## 2016-10-06 NOTE — Progress Notes (Signed)
Subjective:    Patient ID: Katrina Baker, female    DOB: 08-May-1996, 21 y.o.   MRN: [id number]  HPI 21 y.o. WF presents with acne flaire up, use to only get acne around her menses, on aczone and retin A and was doing well for a year until Oct/Nov, and has gotten worse x 1 month. Still on BCP, she is not sexually active. She has drank plenty of water, no change in diet or stress.   BMI is Body mass index is 28.36 kg/m., she is working on diet and exercise. Wt Readings from Last 3 Encounters:  10/06/16 165 lb 3.2 oz (74.9 kg) (90 %, Z= 1.28)*  07/22/16 162 lb (73.5 kg) (89 %, Z= 1.21)*  06/16/16 168 lb (76.2 kg) (91 %, Z= 1.37)*   * Growth percentiles are based on CDC 2-20 Years data.   Medications Current Outpatient Prescriptions on File Prior to Visit  Medication Sig  . Dapsone (ACZONE) 7.5 % GEL Apply 1 application topically daily.  . Levonorgestrel-Ethinyl Estrad (PORTIA-28 PO) Take by mouth daily.  . metFORMIN (GLUCOPHAGE) 500 MG tablet Take 1 tablet (500 mg total) by mouth 2 (two) times daily with a meal.  . naproxen (NAPROSYN) 500 MG tablet Take 500 mg by mouth daily as needed.  . tretinoin (RETIN-A) 0.05 % cream Apply topically at bedtime. Apply pea sized amount at bedtime every other day.   No current facility-administered medications on file prior to visit.     Problem list She has PCOS (polycystic ovarian syndrome) and Vasovagal syncope on her problem list.   Review of Systems  Constitutional: Negative.  Negative for chills and fever.  HENT: Negative.   Respiratory: Negative.   Cardiovascular: Negative.   Gastrointestinal: Negative.  Negative for diarrhea.  Genitourinary: Negative.   Musculoskeletal: Negative.  Negative for arthralgias.  Skin: Positive for rash. Negative for color change and wound.  Neurological: Negative.  Negative for dizziness.       Objective:   Physical Exam  Constitutional: She is oriented to person, place, and time. She appears  well-developed and well-nourished.  HENT:  Head: Normocephalic and atraumatic.  Right Ear: External ear normal.  Left Ear: External ear normal.  Mouth/Throat: Oropharynx is clear and moist.  Eyes: Conjunctivae and EOM are normal. Pupils are equal, round, and reactive to light.  Neck: Normal range of motion. Neck supple. No thyromegaly present.  Cardiovascular: Normal rate, regular rhythm and normal heart sounds.  Exam reveals no gallop and no friction rub.   No murmur heard. Pulmonary/Chest: Effort normal and breath sounds normal. No respiratory distress. She has no wheezes.  Abdominal: Soft. Bowel sounds are normal. She exhibits no distension and no mass. There is no tenderness. There is no rebound and no guarding.  Musculoskeletal: Normal range of motion.  Lymphadenopathy:    She has no cervical adenopathy.  Neurological: She is alert and oriented to person, place, and time. She displays normal reflexes. No cranial nerve deficit. Coordination normal.  Skin: Skin is warm and dry.  Cystic acne along forehead, chin, erythematous pustules, no evidence of warmth/infection  Psychiatric: She has a normal mood and affect.       Assessment & Plan:   Acne vulgaris Start on ABX x 2-3 months, once daily, continue topical, will try to taper off If this does not help will refer to derm -     minocycline (MINOCIN,DYNACIN) 100 MG capsule; Take 1 capsule (100 mg total) by mouth daily.

## 2016-10-06 NOTE — Patient Instructions (Signed)
Acne Acne is a skin problem that causes pimples. Acne occurs when the pores in the skin get blocked. The pores may become infected with bacteria, or they may become red, sore, and swollen. Acne is a common skin problem, especially for teenagers. Acne usually goes away over time. What are the causes? Each pore contains an oil gland. Oil glands make an oily substance that is called sebum. Acne happens when these glands get plugged with sebum, dead skin cells, and dirt. Then, the bacteria that are normally found in the oil glands multiply and cause inflammation. Acne is commonly triggered by changes in your hormones. These hormonal changes can cause the oil glands to get bigger and to make more sebum. Factors that can make acne worse include:  Hormone changes during:  Adolescence.  Women's menstrual cycles.  Pregnancy.  Oil-based cosmetics and hair products.  Harshly scrubbing the skin.  Strong soaps.  Stress.  Hormone problems that are due to certain diseases.  Long or oily hair rubbing against the skin.  Certain medicines.  Pressure from headbands, backpacks, or shoulder pads.  Exposure to certain oils and chemicals. What increases the risk? This condition is more likely to develop in:  Teenagers.  People who have a family history of acne. What are the signs or symptoms? Acne often occurs on the face, neck, chest, and upper back. Symptoms include:  Small, red bumps (pimples or papules).  Whiteheads.  Blackheads.  Small, pus-filled pimples (pustules).  Big, red pimples or pustules that feel tender. More severe acne can cause:  An infected area that contains a collection of pus (abscess).  Hard, painful, fluid-filled sacs (cysts).  Scars. How is this diagnosed? This condition is diagnosed with a medical history and physical exam. Blood tests may also be done. How is this treated? Treatment for this condition can vary depending on the severity of your acne.  Treatment may include:  Creams and lotions that prevent oil glands from clogging.  Creams and lotions that treat or prevent infections and inflammation.  Antibiotic medicines that are applied to the skin or taken as a pill.  Pills that decrease sebum production.  Birth control pills.  Light or laser treatments.  Surgery.  Injections of medicine into the affected areas.  Chemicals that cause peeling of the skin. Your health care provider will also recommend the best way to take care of your skin. Good skin care is the most important part of treatment. Follow these instructions at home: Skin care  Take care of your skin as told by your health care provider. You may be told to do these things:  Wash your skin gently at least two times each day, as well as:  After you exercise.  Before you go to bed.  Use mild soap.  Apply a water-based skin moisturizer after you wash your skin.  Use a sunscreen or sunblock with SPF 30 or greater. This is especially important if you are using acne medicines.  Choose cosmetics that will not plug your oil glands (are noncomedogenic). Medicines   Take over-the-counter and prescription medicines only as told by your health care provider.  If you were prescribed an antibiotic medicine, apply or take it as told by your health care provider. Do not stop taking the antibiotic even if your condition improves. General instructions   Keep your hair clean and off of your face. If you have oily hair, shampoo your hair regularly or daily.  Avoid leaning your chin or forehead against your  hands.  Avoid wearing tight headbands or hats.  Avoid picking or squeezing your pimples. That can make your acne worse and cause scarring.  Keep all follow-up visits as told by your health care provider. This is important.  Shave gently and only when necessary.  Keep a food journal to figure out if any foods are linked with your acne. Contact a health care  provider if:  Your acne is not better after eight weeks.  Your acne gets worse.  You have a large area of skin that is red or tender.  You think that you are having side effects from any acne medicine. This information is not intended to replace advice given to you by your health care provider. Make sure you discuss any questions you have with your health care provider. Document Released: 05/27/2000 Document Revised: 01/29/2016 Document Reviewed: 08/06/2014 Elsevier Interactive Patient Education  2017 [employer].

## 2016-10-31 DIAGNOSIS — Z01419 Encounter for gynecological examination (general) (routine) without abnormal findings: Secondary | ICD-10-CM | POA: Diagnosis not present

## 2016-12-06 ENCOUNTER — Ambulatory Visit: Payer: Self-pay | Admitting: Physician Assistant

## 2016-12-19 ENCOUNTER — Ambulatory Visit: Payer: Self-pay | Admitting: Internal Medicine

## 2016-12-20 ENCOUNTER — Ambulatory Visit: Payer: Self-pay | Admitting: Internal Medicine

## 2016-12-23 ENCOUNTER — Ambulatory Visit: Payer: Self-pay | Admitting: Internal Medicine

## 2016-12-23 DIAGNOSIS — Z0289 Encounter for other administrative examinations: Secondary | ICD-10-CM

## 2016-12-27 ENCOUNTER — Ambulatory Visit: Payer: 59 | Admitting: Internal Medicine

## 2016-12-27 ENCOUNTER — Encounter: Payer: Self-pay | Admitting: Internal Medicine

## 2016-12-27 DIAGNOSIS — Z0289 Encounter for other administrative examinations: Secondary | ICD-10-CM

## 2016-12-29 ENCOUNTER — Telehealth: Payer: Self-pay | Admitting: Internal Medicine

## 2016-12-29 NOTE — Telephone Encounter (Addendum)
Patient dismissed from [facility] by Dr. Philemon Kingdom  On 12/27/16   PWR 12/29/16

## 2017-01-02 NOTE — Telephone Encounter (Signed)
Dismissal letter sent out by certified / registered mail on December 29, 2016 daj

## 2017-01-06 NOTE — Telephone Encounter (Signed)
Received signed domestic return receipt verifying delivery of certified letter on January 03, 2017 Article number [id number]

## 2017-02-07 ENCOUNTER — Encounter: Payer: Self-pay | Admitting: Physician Assistant

## 2017-02-07 MED ORDER — METFORMIN HCL 500 MG PO TABS: 500.0000 mg | ORAL_TABLET | Freq: Two times a day (BID) | ORAL | 0 refills | Status: DC

## 2017-03-20 ENCOUNTER — Ambulatory Visit (INDEPENDENT_AMBULATORY_CARE_PROVIDER_SITE_OTHER): Payer: 59 | Admitting: Physician Assistant

## 2017-03-20 VITALS — BP 110/72 | HR 73 | Temp 97.9°F | Resp 18 | Ht 64.0 in | Wt 176.4 lb

## 2017-03-20 DIAGNOSIS — E282 Polycystic ovarian syndrome: Secondary | ICD-10-CM | POA: Diagnosis not present

## 2017-03-20 MED ORDER — METFORMIN HCL 500 MG PO TABS: 500.0000 mg | ORAL_TABLET | Freq: Two times a day (BID) | ORAL | 0 refills | Status: DC

## 2017-03-20 NOTE — Patient Instructions (Signed)
Can do zantac 150-300 mg at night for 2 weeks, then you can stop.  Avoid alcohol, spicy foods, NSAIDS (aleve, ibuprofen) at this time. See foods below.   Food Choices for Gastroesophageal Reflux Disease When you have gastroesophageal reflux disease (GERD), the foods you eat and your eating habits are very important. Choosing the right foods can help ease the discomfort of GERD. WHAT GENERAL GUIDELINES DO I NEED TO FOLLOW?  Choose fruits, vegetables, whole grains, low-fat dairy products, and low-fat meat, fish, and poultry.  Limit fats such as oils, salad dressings, butter, nuts, and avocado.  Keep a food diary to identify foods that cause symptoms.  Avoid foods that cause reflux. These may be different for different people.  Eat frequent small meals instead of three large meals each day.  Eat your meals slowly, in a relaxed setting.  Limit fried foods.  Cook foods using methods other than frying.  Avoid drinking alcohol.  Avoid drinking large amounts of liquids with your meals.  Avoid bending over or lying down until 2-3 hours after eating. WHAT FOODS ARE NOT RECOMMENDED? The following are some foods and drinks that may worsen your symptoms: Vegetables Tomatoes. Tomato juice. Tomato and spaghetti sauce. Chili peppers. Onion and garlic. Horseradish. Fruits Oranges, grapefruit, and lemon (fruit and juice). Meats High-fat meats, fish, and poultry. This includes hot dogs, ribs, ham, sausage, salami, and bacon. Dairy Whole milk and chocolate milk. Sour cream. Cream. Butter. Ice cream. Cream cheese.  Beverages Coffee and tea, with or without caffeine. Carbonated beverages or energy drinks. Condiments Hot sauce. Barbecue sauce.  Sweets/Desserts Chocolate and cocoa. Donuts. Peppermint and spearmint. Fats and Oils High-fat foods, including [place] fries and potato chips. Other Vinegar. Strong spices, such as black pepper, white pepper, red pepper, cayenne, curry powder, cloves,  ginger, and chili powder.    Costochondritis Costochondritis is swelling and irritation (inflammation) of the tissue (cartilage) that connects your ribs to your breastbone (sternum). This causes pain in the front of your chest. Usually, the pain:  Starts gradually.  Is in more than one rib.  This condition usually goes away on its own over time. Follow these instructions at home:  Do not do anything that makes your pain worse.  If directed, put ice on the painful area: ? Put ice in a plastic bag. ? Place a towel between your skin and the bag. ? Leave the ice on for 20 minutes, 2-3 times a day.  If directed, put heat on the affected area as often as told by your doctor. Use the heat source that your doctor tells you to use, such as a moist heat pack or a heating pad. ? Place a towel between your skin and the heat source. ? Leave the heat on for 20-30 minutes. ? Take off the heat if your skin turns bright red. This is very important if you cannot feel pain, heat, or cold. You may have a greater risk of getting burned.  Take over-the-counter and prescription medicines only as told by your doctor.  Return to your normal activities as told by your doctor. Ask your doctor what activities are safe for you.  Keep all follow-up visits as told by your doctor. This is important. Contact a doctor if:  You have chills or a fever.  Your pain does not go away or it gets worse.  You have a cough that does not go away. Get help right away if:  You are short of breath. This information is not  intended to replace advice given to you by your health care provider. Make sure you discuss any questions you have with your health care provider. Document Released: 11/16/2007 Document Revised: 12/18/2015 Document Reviewed: 09/23/2015 Elsevier Interactive Patient Education  [employer].

## 2017-03-20 NOTE — Progress Notes (Signed)
   Assessment and Plan:  PCOS (polycystic ovarian syndrome) -     metFORMIN (GLUCOPHAGE) 500 MG tablet; Take 1 tablet (500 mg total) by mouth 2 (two) times daily with a meal.   No future appointments.  HPI 21 y.o.female presents for follow up for medications.  She was following with Dr. Cruzita Lederer but was dismissed from the practice for missing appointments.  She states she needs someone to refill her metformin for her, she states the metformin helps with weight loss.  BMI is Body mass index is 30.28 kg/m., she is working on diet and exercise. Wt Readings from Last 3 Encounters:  03/20/17 176 lb 6.4 oz (80 kg)  10/06/16 165 lb 3.2 oz (74.9 kg) (90 %, Z= 1.28)*  07/22/16 162 lb (73.5 kg) (89 %, Z= 1.21)*   * Growth percentiles are based on CDC 2-20 Years data.     Past Medical History:  Diagnosis Date  . Allergy      No Known Allergies  Current Outpatient Prescriptions on File Prior to Visit  Medication Sig  . Levonorgestrel-Ethinyl Estrad (PORTIA-28 PO) Take by mouth daily.  . metFORMIN (GLUCOPHAGE) 500 MG tablet Take 1 tablet (500 mg total) by mouth 2 (two) times daily with a meal.  . naproxen (NAPROSYN) 500 MG tablet Take 500 mg by mouth daily as needed.   No current facility-administered medications on file prior to visit.     ROS: all negative except above.   Physical Exam: Filed Weights   03/20/17 1422  Weight: 176 lb 6.4 oz (80 kg)   BP 110/72   Pulse 73   Temp 97.9 F (36.6 C)   Resp 18   Ht 5\' 4"  (1.626 m)   Wt 176 lb 6.4 oz (80 kg)   LMP 03/10/2017   SpO2 99%   BMI 30.28 kg/m  General Appearance: Well nourished, in no apparent distress. Eyes: PERRLA, EOMs, conjunctiva no swelling or erythema Sinuses: No Frontal/maxillary tenderness ENT/Mouth: Ext aud canals clear, TMs without erythema, bulging. No erythema, swelling, or exudate on post pharynx.  Tonsils not swollen or erythematous. Hearing normal.  Neck: Supple, thyroid normal.  Respiratory:  Respiratory effort normal, BS equal bilaterally without rales, rhonchi, wheezing or stridor.  Cardio: RRR with no MRGs. Brisk peripheral pulses without edema.  Abdomen: Soft, + BS.  Non tender, no guarding, rebound, hernias, masses. Lymphatics: Non tender without lymphadenopathy.  Musculoskeletal: Full ROM, 5/5 strength, normal gait.  Skin: Warm, dry without rashes, lesions, ecchymosis.  Neuro: Cranial nerves intact. Normal muscle tone, no cerebellar symptoms. Sensation intact.  Psych: Awake and oriented X 3, normal affect, Insight and Judgment appropriate.     Vicie Mutters, PA-C 2:36 PM [facility]

## 2017-05-03 ENCOUNTER — Ambulatory Visit: Payer: 59 | Admitting: Physician Assistant

## 2017-05-03 ENCOUNTER — Encounter: Payer: Self-pay | Admitting: Physician Assistant

## 2017-05-03 VITALS — BP 122/80 | HR 93 | Temp 97.5°F | Ht 64.0 in | Wt 176.0 lb

## 2017-05-03 DIAGNOSIS — R05 Cough: Secondary | ICD-10-CM

## 2017-05-03 DIAGNOSIS — R059 Cough, unspecified: Secondary | ICD-10-CM

## 2017-05-03 MED ORDER — PREDNISONE 20 MG PO TABS: ORAL_TABLET | ORAL | 0 refills | Status: DC

## 2017-05-03 MED ORDER — BENZONATATE 100 MG PO CAPS: 100.0000 mg | ORAL_CAPSULE | Freq: Three times a day (TID) | ORAL | 0 refills | Status: DC | PRN

## 2017-05-03 MED ORDER — AZITHROMYCIN 250 MG PO TABS: ORAL_TABLET | ORAL | 1 refills | Status: AC

## 2017-05-03 NOTE — Patient Instructions (Signed)

## 2017-05-03 NOTE — Progress Notes (Signed)
Subjective:    Patient ID: Katrina Baker, female    DOB: [birth year], 21 y.o.   MRN: [id number]  HPI 21 y.o. WF presents with cough x 3 weeks.  She has sinus congestion, pain, cough with some yellow mucus, ear fullness, sore throat, no SOB, wheezing. No fever, chills. Has been taking claritin and OTC cough.   Blood pressure 122/80, pulse 93, temperature (!) 97.5 F (36.4 C), height 5\' 4"  (1.626 m), weight 176 lb (79.8 kg), last menstrual period 04/30/2017, SpO2 96 %.  Medications Current Outpatient Medications on File Prior to Visit  Medication Sig  . Levonorgestrel-Ethinyl Estrad (PORTIA-28 PO) Take by mouth daily.  . metFORMIN (GLUCOPHAGE) 500 MG tablet Take 1 tablet (500 mg total) by mouth 2 (two) times daily with a meal.  . naproxen (NAPROSYN) 500 MG tablet Take 500 mg by mouth daily as needed.   No current facility-administered medications on file prior to visit.     Problem list She has PCOS (polycystic ovarian syndrome) and Vasovagal syncope on their problem list.   Review of Systems  Constitutional: Negative for chills and diaphoresis.  HENT: Positive for congestion, postnasal drip, sinus pressure and sneezing. Negative for ear pain and sore throat.   Respiratory: Positive for cough. Negative for chest tightness, shortness of breath and wheezing.   Cardiovascular: Negative.   Gastrointestinal: Negative.   Genitourinary: Negative.   Musculoskeletal: Negative for neck pain.  Neurological: Positive for headaches.       Objective:   Physical Exam  Constitutional: She is oriented to person, place, and time. She appears well-developed and well-nourished.  HENT:  Right Ear: Hearing and external ear normal. No mastoid tenderness. Tympanic membrane is injected. Tympanic membrane is not perforated, not erythematous, not retracted and not bulging. A middle ear effusion is present.  Left Ear: Hearing and external ear normal. No mastoid tenderness. Tympanic membrane is  injected. Tympanic membrane is not perforated, not erythematous, not retracted and not bulging. A middle ear effusion is present.  Nose: Right sinus exhibits maxillary sinus tenderness. Left sinus exhibits maxillary sinus tenderness.  Mouth/Throat: Uvula is midline, oropharynx is clear and moist and mucous membranes are normal.  Eyes: Conjunctivae and EOM are normal. Pupils are equal, round, and reactive to light.  Neck: Neck supple.  Cardiovascular: Normal rate and regular rhythm.  Pulmonary/Chest: Effort normal and breath sounds normal. No respiratory distress. She has no wheezes.  Abdominal: Soft. Bowel sounds are normal.  Musculoskeletal: Normal range of motion.  Lymphadenopathy:    She has no cervical adenopathy.  Neurological: She is alert and oriented to person, place, and time.  Skin: Skin is warm and dry.       Assessment & Plan:  Katrina Baker was seen today for uri.  Diagnoses and all orders for this visit:  Cough -     azithromycin (ZITHROMAX) 250 MG tablet; Take 2 tablets (500 mg) on  Day 1,  followed by 1 tablet (250 mg) once daily on Days 2 through 5. -     predniSONE (DELTASONE) 20 MG tablet; 2 tablets daily for 3 days, 1 tablet daily for 4 days. -     benzonatate (TESSALON PERLES) 100 MG capsule; Take 1 capsule (100 mg total) by mouth 3 (three) times daily as needed for cough (Max: 600mg  per day).  The patient was advised to call immediately if she has any concerning symptoms in the interval. The patient voices understanding of current treatment options and is in agreement with the  current care plan.The patient knows to call the clinic with any problems, questions or concerns or go to the ER if any further progression of symptoms.   \ Future Appointments  Date Time Provider [place]  10/03/2017  9:00 AM Vicie Mutters, PA-C [facility] None

## 2017-05-08 ENCOUNTER — Encounter: Payer: Self-pay | Admitting: Physician Assistant

## 2017-05-08 ENCOUNTER — Other Ambulatory Visit: Payer: Self-pay | Admitting: Physician Assistant

## 2017-05-08 MED ORDER — SULFAMETHOXAZOLE-TRIMETHOPRIM 800-160 MG PO TABS: 1.0000 | ORAL_TABLET | Freq: Two times a day (BID) | ORAL | 0 refills | Status: DC

## 2017-05-15 ENCOUNTER — Encounter: Payer: Self-pay | Admitting: Physician Assistant

## 2017-05-16 MED ORDER — PROMETHAZINE-DM 6.25-15 MG/5ML PO SYRP: 5.0000 mL | ORAL_SOLUTION | Freq: Four times a day (QID) | ORAL | 1 refills | Status: DC | PRN

## 2017-05-16 MED ORDER — ALBUTEROL SULFATE HFA 108 (90 BASE) MCG/ACT IN AERS: 2.0000 | INHALATION_SPRAY | RESPIRATORY_TRACT | 0 refills | Status: DC | PRN

## 2017-09-17 DIAGNOSIS — S70361A Insect bite (nonvenomous), right thigh, initial encounter: Secondary | ICD-10-CM | POA: Diagnosis not present

## 2017-09-28 NOTE — Progress Notes (Deleted)
Complete Physical  Assessment and Plan:  Discussed med's effects and SE's. Screening labs and tests as requested with regular follow-up as recommended. Over 40 minutes of exam, counseling, chart review, and complex, high level critical decision making was performed this visit.   HPI  22 y.o. female  presents for a complete physical and follow up for has PCOS (polycystic ovarian syndrome) and Vasovagal syncope on their problem list..  Her blood pressure {HAS HAS NOT:18834} been controlled at home, today their BP is   She {DOES_DOES SKA:76811} workout. She denies chest pain, shortness of breath, dizziness.   She has a history of PCOS.  Last A1C in the office was:  Lab Results  Component Value Date   HGBA1C 5.0 03/28/2016   BMI is There is no height or weight on file to calculate BMI., she is working on diet and exercise. Wt Readings from Last 3 Encounters:  05/03/17 176 lb (79.8 kg)  03/20/17 176 lb 6.4 oz (80 kg)  10/06/16 165 lb 3.2 oz (74.9 kg) (90 %, Z= 1.28)*   * Growth percentiles are based on CDC (Girls, 2-20 Years) data.     Current Medications:  Current Outpatient Medications on File Prior to Visit  Medication Sig Dispense Refill  . albuterol (VENTOLIN HFA) 108 (90 Base) MCG/ACT inhaler Inhale 2 puffs into the lungs every 4 (four) hours as needed for wheezing or shortness of breath. 1 Inhaler 0  . benzonatate (TESSALON PERLES) 100 MG capsule Take 1 capsule (100 mg total) by mouth 3 (three) times daily as needed for cough (Max: 600mg  per day). 60 capsule 0  . Levonorgestrel-Ethinyl Estrad (PORTIA-28 PO) Take by mouth daily.    . metFORMIN (GLUCOPHAGE) 500 MG tablet Take 1 tablet (500 mg total) by mouth 2 (two) times daily with a meal. 180 tablet 0  . naproxen (NAPROSYN) 500 MG tablet Take 500 mg by mouth daily as needed.    . predniSONE (DELTASONE) 20 MG tablet 2 tablets daily for 3 days, 1 tablet daily for 4 days. 10 tablet 0  . promethazine-dextromethorphan  (PROMETHAZINE-DM) 6.25-15 MG/5ML syrup Take 5 mLs by mouth 4 (four) times daily as needed for cough. 240 mL 1  . sulfamethoxazole-trimethoprim (BACTRIM DS,SEPTRA DS) 800-160 MG tablet Take 1 tablet by mouth 2 (two) times daily. 14 tablet 0   No current facility-administered medications on file prior to visit.    Allergies:  No Known Allergies Medical History:  She has PCOS (polycystic ovarian syndrome) and Vasovagal syncope on their problem list. Health Maintenance:    There is no immunization history on file for this patient.  Tetanus: Pneumovax: Prevnar 13:  Flu vaccine: Zostavax: LMP: Pap: MGM:  DEXA: Colonoscopy: EGD:  Last Dental Exam: Last Eye Exam: Patient Care Team: Unk Pinto, MD as PCP - General (Internal Medicine)  Surgical History:  She has no past surgical history on file. Family History:  Herfamily history includes COPD in her maternal grandmother; Cancer in her maternal aunt and paternal grandmother; Diabetes in her father and maternal grandfather; Heart Problems in her mother; High Cholesterol in her father; High blood pressure in her father; Hypertension in her maternal grandfather. Social History:  She reports that she has never smoked. She has never used smokeless tobacco. She reports that she does not drink alcohol or use drugs.  Review of Systems: ROS  Physical Exam: Estimated body mass index is 30.21 kg/m as calculated from the following:   Height as of 05/03/17: 5\' 4"  (1.626 m).  Weight as of 05/03/17: 176 lb (79.8 kg). There were no vitals taken for this visit. General Appearance: Well nourished, in no apparent distress.  Eyes: PERRLA, EOMs, conjunctiva no swelling or erythema, normal fundi and vessels.  Sinuses: No Frontal/maxillary tenderness  ENT/Mouth: Ext aud canals clear, normal light reflex with TMs without erythema, bulging. Good dentition. No erythema, swelling, or exudate on post pharynx. Tonsils not swollen or erythematous.  Hearing normal.  Neck: Supple, thyroid normal. No bruits  Respiratory: Respiratory effort normal, BS equal bilaterally without rales, rhonchi, wheezing or stridor.  Cardio: RRR without murmurs, rubs or gallops. Brisk peripheral pulses without edema.  Chest: symmetric, with normal excursions and percussion.  Breasts: Symmetric, without lumps, nipple discharge, retractions.  Abdomen: Soft, nontender, no guarding, rebound, hernias, masses, or organomegaly.  Lymphatics: Non tender without lymphadenopathy.  Genitourinary:  Musculoskeletal: Full ROM all peripheral extremities,5/5 strength, and normal gait.  Skin: Warm, dry without rashes, lesions, ecchymosis. Neuro: Cranial nerves intact, reflexes equal bilaterally. Normal muscle tone, no cerebellar symptoms. Sensation intact.  Psych: Awake and oriented X 3, normal affect, Insight and Judgment appropriate.   EKG: WNL no ST changes. AORTA SCAN: WNL   Vicie Mutters 7:53 AM [facility]

## 2017-10-03 ENCOUNTER — Encounter: Payer: Self-pay | Admitting: Physician Assistant

## 2017-10-03 DIAGNOSIS — Z0000 Encounter for general adult medical examination without abnormal findings: Secondary | ICD-10-CM

## 2017-11-21 DIAGNOSIS — L817 Pigmented purpuric dermatosis: Secondary | ICD-10-CM | POA: Diagnosis not present

## 2018-01-05 ENCOUNTER — Encounter: Payer: Self-pay | Admitting: Physician Assistant

## 2018-01-10 ENCOUNTER — Ambulatory Visit: Payer: Self-pay | Admitting: Adult Health

## 2018-01-10 NOTE — Progress Notes (Signed)
Assessment and Plan:  Katrina Baker was seen today for rash.  Diagnoses and all orders for this visit:  Rash and other nonspecific skin eruption Single lesion, non-specific, resolving. ? Insect bite or excoriated acne lesion. Reassured.   Further disposition pending results of labs. Discussed med's effects and SE's.   Over 5 minutes of exam, counseling, chart review, and critical decision making was performed.   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 Not itchy or particularly painful, no discharge. Seems to be resolving without intervention. Currently scabbed over. Hasn't tried anything.  Past Medical History:  Diagnosis Date  . Allergy      No Known Allergies  Current Outpatient Medications on File Prior to Visit  Medication Sig  . metFORMIN (GLUCOPHAGE) 500 MG tablet Take 1 tablet (500 mg total) by mouth 2 (two) times daily with a meal.  . naproxen (NAPROSYN) 500 MG tablet Take 500 mg by mouth daily as needed.  . norgestimate-ethinyl estradiol (SPRINTEC 28) 0.25-35 MG-MCG tablet Take 1 tablet by mouth daily.  Marland Kitchen albuterol (VENTOLIN HFA) 108 (90 Base) MCG/ACT inhaler Inhale 2 puffs into the lungs every 4 (four) hours as needed for wheezing or shortness of breath.  . benzonatate (TESSALON PERLES) 100 MG capsule Take 1 capsule (100 mg total) by mouth 3 (three) times daily as needed for cough (Max: 600mg  per day).  . Levonorgestrel-Ethinyl Estrad (PORTIA-28 PO) Take by mouth daily.  . predniSONE (DELTASONE) 20 MG tablet 2  tablets daily for 3 days, 1 tablet daily for 4 days.  . promethazine-dextromethorphan (PROMETHAZINE-DM) 6.25-15 MG/5ML syrup Take 5 mLs by mouth 4 (four) times daily as needed for cough.  . sulfamethoxazole-trimethoprim (BACTRIM DS,SEPTRA DS) 800-160 MG tablet Take 1 tablet by mouth 2 (two) times daily.   No current facility-administered medications on file prior to visit.     ROS: all negative except above.   Physical Exam:  BP 112/70   Pulse 75   Temp (!) 97.4 F (36.3 C)   Ht 5\' 4"  (1.626 m)   Wt 177 lb (80.3 kg)   SpO2 97%   BMI 30.38 kg/m   General Appearance: Well nourished, in no apparent distress. Neck: Supple Respiratory: Respiratory effort normal Cardio: RRR with no MRGs.  Abdomen: Soft, + BS.  Non tender. Lymphatics: Non tender without lymphadenopathy.  Musculoskeletal: normal gait.  Skin: Warm, dry; single scabbed over non-specific lesion to back without discharge; appears as scabbed papule without discharge or surrounding erythema.  Psych: Awake and oriented X 3, normal affect, Insight and Judgment appropriate.    Izora Ribas, NP 4:08 PM [facility]

## 2018-01-11 ENCOUNTER — Ambulatory Visit: Payer: 59 | Admitting: Adult Health

## 2018-01-11 ENCOUNTER — Encounter: Payer: Self-pay | Admitting: Adult Health

## 2018-01-11 VITALS — BP 112/70 | HR 75 | Temp 97.4°F | Ht 64.0 in | Wt 177.0 lb

## 2018-01-11 DIAGNOSIS — R21 Rash and other nonspecific skin eruption: Secondary | ICD-10-CM | POA: Diagnosis not present

## 2018-04-27 ENCOUNTER — Encounter: Payer: Self-pay | Admitting: Adult Health Nurse Practitioner

## 2018-04-27 ENCOUNTER — Ambulatory Visit: Payer: 59 | Admitting: Adult Health Nurse Practitioner

## 2018-04-27 VITALS — BP 128/82 | HR 114 | Temp 97.9°F | Ht 64.0 in | Wt 177.0 lb

## 2018-04-27 DIAGNOSIS — B351 Tinea unguium: Secondary | ICD-10-CM

## 2018-04-27 DIAGNOSIS — Z23 Encounter for immunization: Secondary | ICD-10-CM

## 2018-04-27 LAB — COMPLETE METABOLIC PANEL WITH GFR
AG Ratio: 1.6 (calc) (ref 1.0–2.5)
ALT: 12 U/L (ref 6–29)
Calcium: 10.2 mg/dL (ref 8.6–10.2)
Chloride: 105 mmol/L (ref 98–110)
GFR, Est African American: 122 mL/min/{1.73_m2} (ref >=60)
Globulin: 2.9 g/dL (calc) (ref 1.9–3.7)
Potassium: 4.6 mmol/L (ref 3.5–5.3)
Sodium: 144 mmol/L (ref 135–146)
Total Bilirubin: 0.4 mg/dL (ref 0.2–1.2)
Total Protein: 7.5 g/dL (ref 6.1–8.1)

## 2018-04-27 LAB — COMPLETE METABOLIC PANEL WITHOUT GFR
AST: 14 U/L (ref 10–30)
Albumin: 4.6 g/dL (ref 3.6–5.1)
Alkaline phosphatase (APISO): 81 U/L (ref 33–115)
BUN: 10 mg/dL (ref 7–25)
CO2: 29 mmol/L (ref 20–32)
Creat: 0.8 mg/dL (ref 0.50–1.10)
GFR, Est Non African American: 105 mL/min/1.73m2 (ref >=60)
Glucose, Bld: 98 mg/dL (ref 65–99)

## 2018-04-27 LAB — CBC WITH DIFFERENTIAL/PLATELET
Basophils Absolute: 23 {cells}/uL (ref 0–200)
Basophils Relative: 0.3 %
Eosinophils Absolute: 47 cells/uL (ref 15–500)
Eosinophils Relative: 0.6 %
HCT: 44.8 % (ref 35.0–45.0)
Hemoglobin: 15.1 g/dL (ref 11.7–15.5)
Lymphs Abs: 1365 {cells}/uL (ref 850–3900)
MCH: 29.9 pg (ref 27.0–33.0)
MCHC: 33.7 g/dL (ref 32.0–36.0)
MCV: 88.7 fL (ref 80.0–100.0)
MPV: 9.7 fL (ref 7.5–12.5)
Monocytes Relative: 5.7 %
Neutro Abs: 5920 {cells}/uL (ref 1500–7800)
Neutrophils Relative %: 75.9 %
Platelets: 307 10*3/uL (ref 140–400)
RBC: 5.05 Million/uL (ref 3.80–5.10)
RDW: 12.2 % (ref 11.0–15.0)
Total Lymphocyte: 17.5 %
WBC mixed population: 445 cells/uL (ref 200–950)
WBC: 7.8 10*3/uL (ref 3.8–10.8)

## 2018-04-27 NOTE — Progress Notes (Signed)
Assessment and Plan:   Audi was seen today for nail problem and travel consult.  Diagnoses and all orders for this visit:  Onychomycosis of left great toe  -     CBC with Differential/Platelet -     COMPLETE METABOLIC PANEL WITH GFR  Need for prophylatic vaccination with [place] encephalitis vaccine Will be traveling abroad this Spring with [employer] Discussed contacting Health department for this  Need for prophylactic vaccination against yellow fever Will be traveling abroad this Spring with [employer] Discussed contacting Health department for this  All other vaccinations reviewed and up to date.  Discussed medication and side effects with patient Pending labs will send Rx for TERBINAFINE 250mg  daily x12 weeks.  Will follow up in 6 weeks for repeat labs for liver function. Will discuss results and continuation of medications    Further disposition pending results of labs. Discussed med's effects and SE's.   Over 30 minutes of exam, counseling, chart review, and critical decision making was performed.   Future Appointments  Date Time Provider [place]  10/04/2018  9:00 AM Vicie Mutters, PA-C [facility] None    ------------------------------------------------------------------------------------------------------------------   HPI 22 y.o.female presents for pain with foot.  Reports that three years ago she reports she used a public shower and got a fungal infection.  Also reports she had trauma to her left foot great toe.  She had her toenail removed at this time.  Reports that she was also given some fungal topical treatment for this.  Reports that the fungal has not completely gone away.  She is here today for evaluation of this toe.  She denies any pain, itching, bunring, erythema, or pain with ambulation.   Patient is going to be studying abroad this Spring 2020 with [employer].  She will traveling to [place] and need yellow fever vaccination prior to her trip.  Recommend to  contact health department for this and verification of needed vaccinations.  Past Medical History:  Diagnosis Date  . Allergy      No Known Allergies  Current Outpatient Medications on File Prior to Visit  Medication Sig  . Levonorgestrel-Ethinyl Estrad (PORTIA-28 PO) Take by mouth daily.  . metFORMIN (GLUCOPHAGE) 500 MG tablet Take 1 tablet (500 mg total) by mouth 2 (two) times daily with a meal.  . naproxen (NAPROSYN) 500 MG tablet Take 500 mg by mouth daily as needed.  Marland Kitchen albuterol (VENTOLIN HFA) 108 (90 Base) MCG/ACT inhaler Inhale 2 puffs into the lungs every 4 (four) hours as needed for wheezing or shortness of breath.  . benzonatate (TESSALON PERLES) 100 MG capsule Take 1 capsule (100 mg total) by mouth 3 (three) times daily as needed for cough (Max: 600mg  per day).  . norgestimate-ethinyl estradiol (SPRINTEC 28) 0.25-35 MG-MCG tablet Take 1 tablet by mouth daily.  . predniSONE (DELTASONE) 20 MG tablet 2 tablets daily for 3 days, 1 tablet daily for 4 days.  . promethazine-dextromethorphan (PROMETHAZINE-DM) 6.25-15 MG/5ML syrup Take 5 mLs by mouth 4 (four) times daily as needed for cough.  . sulfamethoxazole-trimethoprim (BACTRIM DS,SEPTRA DS) 800-160 MG tablet Take 1 tablet by mouth 2 (two) times daily.   No current facility-administered medications on file prior to visit.     FVC:BSWHQP of Systems  Constitutional: Negative for chills, diaphoresis, fever, malaise/fatigue and weight loss.  Musculoskeletal: Negative for back pain, falls, joint pain, myalgias and neck pain.  Skin: Negative for itching and rash.       Left great toe fungus.  Neurological: Negative for  dizziness, tingling, tremors, sensory change, speech change, focal weakness, seizures, loss of consciousness, weakness and headaches.     Physical Exam:  BP 128/82   Pulse (!) 114   Temp 97.9 F (36.6 C)   Ht 5\' 4"  (1.626 m)   Wt 177 lb (80.3 kg)   SpO2 97%   BMI 30.38 kg/m   General Appearance: Well  nourished, in no apparent distress.  Respiratory: Respiratory effort normal, BS equal bilaterally without rales, rhonchi, wheezing or stridor.   Cardio: RRR with no MRGs. Brisk peripheral pulses without edema.   Abdomen: Soft, + BS.  Non tender, no guarding, rebound, hernias, masses.  Lymphatics: Non tender without lymphadenopathy.  Musculoskeletal: Full ROM, 5/5 strength, normal gait.  Skin: Warm, dry without rashes, lesions, ecchymosis. Left great tow yellowing of mail with white markings on toe. Thickening of nail. Neuro: Cranial nerves intact. Normal muscle tone, no cerebellar symptoms. Sensation intact.  Psych: Awake and oriented X 3, normal affect, Insight and Judgment appropriate.     Garnet Sierras, NP 10:56 AM [facility]

## 2018-04-27 NOTE — Patient Instructions (Addendum)
We have checked labs today You will be notified via MyChart of the results If your loabs are in normal range we will send in oral medication for you to take once a day and follow up in 6 weeks to re-check labs Please let us know If you have nay new or worsening symptoms   Onychomycosis/Fungal Toenails  WHAT IS IT? An infection that lies within the keratin of your nail plate that is caused by a fungus.  WHY ME? Fungal infections affect all ages, sexes, races, and creeds.  There may be many factors that predispose you to a fungal infection such as age, coexisting medical conditions such as diabetes, or an autoimmune disease; stress, medications, fatigue, genetics, etc.  Bottom line: fungus thrives in a warm, moist environment and your shoes offer such a location.  IS IT CONTAGIOUS? Theoretically, yes.  You do not want to share shoes, nail clippers or files with someone who has fungal toenails.  Walking around barefoot in the same room or sleeping in the same bed is unlikely to transfer the organism.  It is important to realize, however, that fungus can spread easily from one nail to the next on the same foot.  HOW DO WE TREAT THIS?  There are several ways to treat this condition.  Treatment may depend on many factors such as age, medications, pregnancy, liver and kidney conditions, etc.  It is best to ask your doctor which options are available to you.  1. No treatment.   Unlike many other medical concerns, you can live with this condition.  However for many people this can be a painful condition and may lead to ingrown toenails or a bacterial infection.  It is recommended that you keep the nails cut short to help reduce the amount of fungal nail. 2. Topical treatment.  These range from herbal remedies to prescription strength nail lacquers.  About 40-50% effective, topicals require twice daily application for approximately 9 to 12 months or until an entirely new nail has grown out.  The most  effective topicals are medical grade medications available through physicians offices. 3. Oral antifungal medications.  With an 80-90% cure rate, the most common oral medication requires 3 to 4 months of therapy and stays in your system for a year as the new nail grows out.  Oral antifungal medications do require blood work to make sure it is a safe drug for you.  A liver function panel will be performed prior to starting the medication and after the first month of treatment.  It is important to have the blood work performed to avoid any harmful side effects.  In general, this medication safe but blood work is required. 4. Laser Therapy.  This treatment is performed by applying a specialized laser to the affected nail plate.  This therapy is noninvasive, fast, and non-painful.  It is not covered by insurance and is therefore, out of pocket.  The results have been very good with a 80-95% cure rate.  The [facility] is the only practice in the area to offer this therapy. Permanent Nail Avulsion.  Removing the entire nail so that a new nail will not grow back.

## 2018-04-28 ENCOUNTER — Encounter: Payer: Self-pay | Admitting: Adult Health Nurse Practitioner

## 2018-04-28 MED ORDER — TERBINAFINE HCL 250 MG PO TABS: 250.0000 mg | ORAL_TABLET | Freq: Every day | ORAL | 0 refills | Status: AC

## 2018-06-18 NOTE — Progress Notes (Signed)
Assessment and Plan:  Katrina Baker was seen today for follow-up and medication refill.  Diagnoses and all orders for this visit:  PCOS (polycystic ovarian syndrome) Lifestyle discussed, information further provided on AVS Restart metformin 500 mg BID for weight management  Obesity (BMI 30.0-34.9) Long discussion about weight loss, diet, and exercise Recommended diet heavy in fruits and veggies and low in animal meats, cheeses, and dairy products, appropriate calorie intake Discussed appropriate weight for height  Follow up at next visit  Onychomycosis Tolerating lamisil, check LFTs; recheck after returns from [place], may need second course -     COMPLETE METABOLIC PANEL WITH GFR  Need for immunization against typhoid Information for [facility] health department provided   Other orders -     norgestimate-ethinyl estradiol ([place] 28) 0.25-35 MG-MCG tablet; Take 1 tablet by mouth daily. -     metFORMIN (GLUCOPHAGE) 500 MG tablet; Take 1 tablet (500 mg total) by mouth 2 (two) times daily with a meal.   Future Appointments  Date Time Provider [place]  12/24/2018 10:00 AM Vicie Mutters, PA-C [facility] None    HPI 22 y.o.female presents for follow up for medications.   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 She reports she needs typhoid vaccine prior to leaving.   She is on metfomrin for PCOS (mostly for weight loss), she is established with Dr. Cruzita Lederer for this as well. She would like to restart metformin which she has been off of for a while. Was taking 500 mg BID.   Recently put on lamisil for L toe onychomycosis, tolerating med without problems.   BMI is Body mass index is 29.97 kg/m., she is working on diet and exercise. Wt Readings from Last 3 Encounters:  06/19/18 174 lb 9.6 oz (79.2 kg)  04/27/18 177 lb (80.3 kg)  01/11/18 177 lb (80.3 kg)     Past Medical History:  Diagnosis Date  .  Allergy      No Known Allergies  Current Outpatient Medications on File Prior to Visit  Medication Sig  . naproxen (NAPROSYN) 500 MG tablet Take 500 mg by mouth daily as needed.  . terbinafine (LAMISIL) 250 MG tablet Take 1 tablet (250 mg total) by mouth daily.  Marland Kitchen albuterol (VENTOLIN HFA) 108 (90 Base) MCG/ACT inhaler Inhale 2 puffs into the lungs every 4 (four) hours as needed for wheezing or shortness of breath.  . benzonatate (TESSALON PERLES) 100 MG capsule Take 1 capsule (100 mg total) by mouth 3 (three) times daily as needed for cough (Max: 600mg  per day).  . predniSONE (DELTASONE) 20 MG tablet 2 tablets daily for 3 days, 1 tablet daily for 4 days.  . promethazine-dextromethorphan (PROMETHAZINE-DM) 6.25-15 MG/5ML syrup Take 5 mLs by mouth 4 (four) times daily as needed for cough.  . sulfamethoxazole-trimethoprim (BACTRIM DS,SEPTRA DS) 800-160 MG tablet Take 1 tablet by mouth 2 (two) times daily.   No current facility-administered medications on file prior to visit.     ROS: all negative except above.   Physical Exam: Filed Weights   06/19/18 1529  Weight: 174 lb 9.6 oz (79.2 kg)   BP 110/78   Pulse 84   Temp (!) 97.3 F (36.3 C)   Ht 5\' 4"  (1.626 m)   Wt 174 lb 9.6 oz (79.2 kg)   SpO2 98%   BMI 29.97 kg/m  General Appearance: Well nourished, in no apparent distress. Eyes: PERRLA, EOMs,  conjunctiva no swelling or erythema Sinuses: No Frontal/maxillary tenderness ENT/Mouth: Ext aud canals clear, TMs without erythema, bulging. No erythema, swelling, or exudate on post pharynx.  Tonsils not swollen or erythematous. Hearing normal.  Neck: Supple, thyroid normal.  Respiratory: Respiratory effort normal, BS equal bilaterally without rales, rhonchi, wheezing or stridor.  Cardio: RRR with no MRGs. Brisk peripheral pulses without edema.  Abdomen: Soft, + BS.  Non tender, no guarding, rebound, hernias, masses. Lymphatics: Non tender without lymphadenopathy.  Musculoskeletal:  Symmetrical strength, normal gait.  Skin: Warm, dry without rashes, lesions, ecchymosis. L great toe thickened and yellow; nail growth at base appears without yellow discoloration Neuro: Cranial nerves intact. Normal muscle tone, no cerebellar symptoms. Sensation intact.  Psych: Awake and oriented X 3, normal affect, Insight and Judgment appropriate.    Izora Ribas, NP 3:52 PM [facility]

## 2018-06-19 ENCOUNTER — Ambulatory Visit: Payer: 59 | Admitting: Adult Health

## 2018-06-19 ENCOUNTER — Encounter: Payer: Self-pay | Admitting: Adult Health

## 2018-06-19 VITALS — BP 110/78 | HR 84 | Temp 97.3°F | Ht 64.0 in | Wt 174.6 lb

## 2018-06-19 DIAGNOSIS — E282 Polycystic ovarian syndrome: Secondary | ICD-10-CM | POA: Diagnosis not present

## 2018-06-19 DIAGNOSIS — Z79899 Other long term (current) drug therapy: Secondary | ICD-10-CM | POA: Diagnosis not present

## 2018-06-19 DIAGNOSIS — E669 Obesity, unspecified: Secondary | ICD-10-CM | POA: Insufficient documentation

## 2018-06-19 DIAGNOSIS — Z23 Encounter for immunization: Secondary | ICD-10-CM

## 2018-06-19 DIAGNOSIS — E663 Overweight: Secondary | ICD-10-CM | POA: Insufficient documentation

## 2018-06-19 DIAGNOSIS — B351 Tinea unguium: Secondary | ICD-10-CM | POA: Insufficient documentation

## 2018-06-19 MED ORDER — METFORMIN HCL 500 MG PO TABS: 500.0000 mg | ORAL_TABLET | Freq: Two times a day (BID) | ORAL | 0 refills | Status: DC

## 2018-06-19 MED ORDER — NORGESTIMATE-ETH ESTRADIOL 0.25-35 MG-MCG PO TABS: 1.0000 | ORAL_TABLET | Freq: Every day | ORAL | 0 refills | Status: DC

## 2018-06-19 NOTE — Patient Instructions (Addendum)
[facility]  In [place].  [place] -  [address]  Call [phone], Monday-Friday for individual appointments.    Diet for Polycystic Ovary Syndrome Polycystic ovary syndrome (PCOS) is a disorder of the chemicals (hormones) that regulate a woman's reproductive system, including monthly periods (menstruation). The condition causes important hormones to be out of balance. PCOS can:  Stop your periods or make them irregular.  Cause cysts to develop on your ovaries.  Make it difficult to get pregnant.  Stop your body from responding to the effects of insulin (insulin resistance). Insulin resistance can lead to obesity and diabetes. Changing what you eat can help you manage PCOS and improve your health. Following a balanced diet can help you lose weight and improve the way that your body uses insulin. What are tips for following this plan?  Follow a balanced diet for meals and snacks. Eat breakfast, lunch, dinner, and one or two snacks every day.  Include protein in each meal and snack.  Choose whole grains instead of products that are made with refined flour.  Eat a variety of foods.  Exercise regularly as told by your health care provider. Aim to do 30 or more minutes of exercise on most days of the week.  If you are overweight or obese: ? Pay attention to how many calories you eat. Cutting down on calories can help you lose weight. ? Work with your health care provider or a diet and nutrition specialist (dietitian) to figure out how many calories you need each day. What foods can I eat?  Fruits Include a variety of colors and types. All fruits are helpful for PCOS. Vegetables Include a variety of colors and types. All vegetables are helpful for PCOS. Grains Whole grains, such as whole wheat. Whole-grain breads, crackers, cereals, and pasta. Unsweetened oatmeal, bulgur, barley, quinoa, and brown rice. Tortillas  made from corn or whole-wheat flour. Meats and other proteins Low-fat (lean) proteins, such as fish, chicken, beans, eggs, and tofu. Dairy Low-fat dairy products, such as skim milk, cheese sticks, and yogurt. Beverages Low-fat or fat-free drinks, such as water, low-fat milk, sugar-free drinks, and small amounts of 100% fruit juice. Seasonings and condiments Ketchup. Mustard. Barbecue sauce. Relish. Low-fat or fat-free mayonnaise. Fats and oils Olive oil or canola oil. Walnuts and almonds. The items listed above may not be a complete list of recommended foods and beverages. Contact a dietitian for more options. What foods are not recommended? Foods that are high in calories or fat. Fried foods. Sweets. Products that are made from refined white flour, including white bread, pastries, white rice, and pasta. The items listed above may not be a complete list of foods and beverages to avoid. Contact a dietitian for more information. Summary  PCOS is a hormonal imbalance that affects a woman's reproductive system.  You can help to manage your PCOS by exercising regularly and eating a healthy, varied diet of vegetables, fruit, whole grains, low-fat (lean) protein, and low-fat dairy products.  Changing what you eat can improve the way that your body uses insulin, help your hormones reach normal levels, and help you lose weight. This information is not intended to replace advice given to you by your health care provider. Make sure you discuss any questions you have with your health care provider. Document Released: 09/21/2015 Document Revised: 04/03/2017 Document Reviewed: 04/03/2017 Elsevier Interactive Patient Education  2019 [employer].

## 2018-06-20 LAB — COMPLETE METABOLIC PANEL WITH GFR
AG Ratio: 2 (calc) (ref 1.0–2.5)
ALT: 17 U/L (ref 6–29)
AST: 16 U/L (ref 10–30)
Albumin: 4.6 g/dL (ref 3.6–5.1)
Alkaline phosphatase (APISO): 84 U/L (ref 33–115)
BUN: 7 mg/dL (ref 7–25)
CO2: 25 mmol/L (ref 20–32)
Calcium: 9.8 mg/dL (ref 8.6–10.2)
Chloride: 104 mmol/L (ref 98–110)
Creat: 0.71 mg/dL (ref 0.50–1.10)
GFR, Est African American: 141 mL/min/{1.73_m2} (ref >=60)
GFR, Est Non African American: 122 mL/min/{1.73_m2} (ref >=60)
Globulin: 2.3 g/dL (calc) (ref 1.9–3.7)
Glucose, Bld: 83 mg/dL (ref 65–99)
Potassium: 4.5 mmol/L (ref 3.5–5.3)
Sodium: 139 mmol/L (ref 135–146)
Total Bilirubin: 0.7 mg/dL (ref 0.2–1.2)
Total Protein: 6.9 g/dL (ref 6.1–8.1)

## 2018-10-04 ENCOUNTER — Encounter: Payer: Self-pay | Admitting: Physician Assistant

## 2018-11-02 ENCOUNTER — Telehealth: Payer: Self-pay | Admitting: Adult Health

## 2018-11-02 NOTE — Telephone Encounter (Signed)
Attempted to call patient to follow up on concerns of lower abdominal pain x 2 with no answer.

## 2018-12-20 NOTE — Progress Notes (Signed)
Patient ID: Katrina Baker, female   DOB: [birth year], 23 y.o.   MRN: [id number] COMPLETE PHYSICAL   Assessment and Plan  Encounter for general adult medical examination with abnormal findings 23 year  Obesity (BMI 30.0-34.9) - follow up 3 months for progress monitoring - increase veggies, decrease carbs - long discussion about weight loss, diet, and exercise  Vasovagal syncope Has not happened for a long time  PCOS (polycystic ovarian syndrome) -     TSH Continue metformin  Medication management -     CBC with Differential/Platelet -     COMPLETE METABOLIC PANEL WITH GFR -     Magnesium  Screening cholesterol level -     Lipid panel  Screening, anemia, deficiency, iron -     Vitamin B12 -     Iron,Total/Total Iron Binding Cap  Vitamin D deficiency -     VITAMIN D 25 Hydroxy (Vit-D Deficiency, Fractures)  Continue prudent diet as discussed, weight control, regular exercise, and medications. Routine screening labs and tests as requested with regular follow-up as recommended.  Over 40 minutes of exam, counseling, chart review and critical decision making was performed   Annual Screening Comprehensive Examination   This very nice 23 y.o.female presents for complete physical.  Patient has no major health issues.  Patient reports no complaints at this time.   Finally, patient has history of Vitamin D Deficiency and last vitamin D. Currently on supplementation.  She does have a history of PCOS.  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 She was there 2 weeks and had to come home due to [place].  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    She is on metfomrin for PCOS (mostly for weight loss), she is established with Dr. Cruzita Lederer for this as well but not following.   BMI is Body mass index is 29.46 kg/m., she is working on diet and exercise. Wt Readings from Last 3 Encounters:  12/24/18 171 lb 9.6 oz (77.8 kg)  06/19/18 174 lb  9.6 oz (79.2 kg)  04/27/18 177 lb (80.3 kg)   She does have a history of migraines, states has improved with ear piercing. .       Current Outpatient Medications on File Prior to Visit  Medication Sig Dispense Refill  . metFORMIN (GLUCOPHAGE) 500 MG tablet Take 1 tablet (500 mg total) by mouth 2 (two) times daily with a meal. 360 tablet 0  . naproxen (NAPROSYN) 500 MG tablet Take 500 mg by mouth daily as needed.    . norgestimate-ethinyl estradiol (SPRINTEC 28) 0.25-35 MG-MCG tablet Take 1 tablet by mouth daily. 6 Package 0   No current facility-administered medications on file prior to visit.     There is no immunization history on file for this patient.  Flu shot at work Will get immunization records.   Patient's last menstrual period was 11/28/2018. She is not sexually active, has never been.   Allergies No Known Allergies  SURGICAL HISTORY She  has no past surgical history on file.   FAMILY HISTORY Her family history includes COPD in her maternal grandmother; Cancer in her maternal aunt and paternal grandmother; Diabetes in her father and maternal grandfather; Heart Problems in her mother; High Cholesterol in her father; High blood pressure in her father; Hypertension in her maternal grandfather.   SOCIAL HISTORY She  reports that she has never smoked. She has never used smokeless tobacco. She reports  that she does not drink alcohol or use drugs.    Review of Systems  Constitutional: Negative for chills, fever and malaise/fatigue.  HENT: Negative for congestion, ear pain and sore throat.   Respiratory: Negative for cough, shortness of breath and wheezing.   Cardiovascular: Negative for chest pain, palpitations and leg swelling.  Gastrointestinal: Negative for abdominal pain, blood in stool, constipation, diarrhea, heartburn and melena.  Genitourinary: Negative for dysuria, frequency, hematuria and urgency.  Skin: Negative.   Neurological: Positive for headaches.  Negative for dizziness, sensory change and loss of consciousness.  Psychiatric/Behavioral: Negative for depression. The patient is not nervous/anxious and does not have insomnia.      Physical Exam  BP 118/74   Pulse 78   Temp 97.9 F (36.6 C)   Ht 5\' 4"  (1.626 m)   Wt 171 lb 9.6 oz (77.8 kg)   LMP 11/28/2018   SpO2 99%   BMI 29.46 kg/m   General Appearance: Well nourished and in no apparent distress. Eyes: PERRLA, EOMs, conjunctiva no swelling or erythema, normal fundi and vessels. Sinuses: No frontal/maxillary tenderness ENT/Mouth: EACs patent / TMs  nl. Nares clear without erythema, swelling, mucoid exudates. Oral hygiene is good. No erythema, swelling, or exudate. Tongue normal, non-obstructing. Tonsils not swollen or erythematous. Hearing normal.  Neck: Supple, thyroid normal. No bruits, nodes or JVD. Respiratory: Respiratory effort normal.  BS equal and clear bilateral without rales, rhonci, wheezing or stridor. Cardio: Heart sounds are normal with regular rate and rhythm and no murmurs, rubs or gallops. Peripheral pulses are normal and equal bilaterally without edema. No aortic or femoral bruits. Chest: symmetric with normal excursions and percussion..  Abdomen: Flat, soft, with bowl sounds. Nontender, no guarding, rebound, hernias, masses, or organomegaly.  Lymphatics: Non tender without lymphadenopathy.  Musculoskeletal: Full ROM all peripheral extremities, joint stability, 5/5 strength, and normal gait. Skin: Warm and dry without rashes, lesions, cyanosis, clubbing or  ecchymosis.  Neuro: Cranial nerves intact, reflexes equal bilaterally. Normal muscle tone, no cerebellar symptoms. Sensation intact.  Pysch: Awake and oriented X 3, normal affect, Insight and Judgment appropriate.

## 2018-12-24 ENCOUNTER — Other Ambulatory Visit: Payer: Self-pay

## 2018-12-24 ENCOUNTER — Encounter: Payer: Self-pay | Admitting: Physician Assistant

## 2018-12-24 ENCOUNTER — Ambulatory Visit: Payer: 59 | Admitting: Physician Assistant

## 2018-12-24 VITALS — BP 118/74 | HR 78 | Temp 97.9°F | Ht 64.0 in | Wt 171.6 lb

## 2018-12-24 DIAGNOSIS — Z0001 Encounter for general adult medical examination with abnormal findings: Secondary | ICD-10-CM

## 2018-12-24 DIAGNOSIS — Z0000 Encounter for general adult medical examination without abnormal findings: Secondary | ICD-10-CM

## 2018-12-24 DIAGNOSIS — E559 Vitamin D deficiency, unspecified: Secondary | ICD-10-CM

## 2018-12-24 DIAGNOSIS — E282 Polycystic ovarian syndrome: Secondary | ICD-10-CM

## 2018-12-24 DIAGNOSIS — Z130 Encounter for screening for diseases of the blood and blood-forming organs and certain disorders involving the immune mechanism: Secondary | ICD-10-CM

## 2018-12-24 DIAGNOSIS — E669 Obesity, unspecified: Secondary | ICD-10-CM

## 2018-12-24 DIAGNOSIS — R55 Syncope and collapse: Secondary | ICD-10-CM

## 2018-12-24 DIAGNOSIS — B351 Tinea unguium: Secondary | ICD-10-CM

## 2018-12-24 DIAGNOSIS — Z13220 Encounter for screening for lipoid disorders: Secondary | ICD-10-CM

## 2018-12-24 DIAGNOSIS — Z79899 Other long term (current) drug therapy: Secondary | ICD-10-CM

## 2018-12-24 NOTE — Patient Instructions (Addendum)
Check out  Mini habits for weight loss book  2 apps for tracking food is myfitness pal  loseit OR can take picture of your food   We are starting you on Metformin to prevent or treat diabetes.  Metformin does NOT cause low blood sugars.   In order to create energy your cells need insulin and sugar but sometime your cells do not accept the insulin and this can cause increased sugars and decreased energy.   The Metformin helps your cells accept insulin and the sugar this help: 1) increase your energy  2) weight loss.    The two most common side effects are nausea and diarrhea, follow these rules to avoid it but these symptoms get better with time on the medication.    ALSO You can take imodium per box instructions when starting metformin if needed.   Rules of metformin: 1) start out slow with only one pill daily. Our goal for you is 4 pills a day or 2000mg  total.  2) take with your largest meal. 3) Take with least amount of carbs.   Call if you have any problems.    Drink 1/2 your body weight in fluid ounces of water daily; drink a tall glass of water 30 min before meals  Don't eat until you're stuffed- listen to your stomach and eat until you are 80% full   Try eating off of a salad plate; wait 10 min after finishing before going back for seconds  Start by eating the vegetables on your plate; aim for 50% of your meals to be fruits or vegetables  Then eat your protein - lean meats (grass fed if possible), fish, beans, nuts in moderation  Eat your carbs/starch last ONLY if you still are hungry. If you can, stop before finishing it all  Avoid sugar and flour - the closer it looks to it's original form in nature, typically the better it is for you  Splurge in moderation - "assign" days when you get to splurge and have the "bad stuff" - I like to follow a 80% - 20% plan- "good" choices 80 % of the time, "bad" choices in moderation 20% of the time  Simple equation is: Calories  out > calories in = weight loss - even if you eat the bad stuff, if you limit portions, you will still lose weight

## 2018-12-25 ENCOUNTER — Other Ambulatory Visit: Payer: Self-pay | Admitting: Physician Assistant

## 2018-12-25 LAB — COMPLETE METABOLIC PANEL WITH GFR
AG Ratio: 1.6 (calc) (ref 1.0–2.5)
ALT: 14 U/L (ref 6–29)
AST: 14 U/L (ref 10–30)
Albumin: 4.3 g/dL (ref 3.6–5.1)
Alkaline phosphatase (APISO): 66 U/L (ref 31–125)
BUN: 9 mg/dL (ref 7–25)
CO2: 28 mmol/L (ref 20–32)
Calcium: 9.7 mg/dL (ref 8.6–10.2)
Chloride: 106 mmol/L (ref 98–110)
Creat: 0.74 mg/dL (ref 0.50–1.10)
GFR, Est African American: 134 mL/min/{1.73_m2} (ref >=60)
GFR, Est Non African American: 116 mL/min/{1.73_m2} (ref >=60)
Globulin: 2.7 g/dL (calc) (ref 1.9–3.7)
Glucose, Bld: 76 mg/dL (ref 65–99)
Potassium: 4.4 mmol/L (ref 3.5–5.3)
Sodium: 140 mmol/L (ref 135–146)
Total Bilirubin: 0.4 mg/dL (ref 0.2–1.2)
Total Protein: 7 g/dL (ref 6.1–8.1)

## 2018-12-25 LAB — CBC WITH DIFFERENTIAL/PLATELET
Absolute Monocytes: 320 cells/uL (ref 200–950)
Basophils Absolute: 20 cells/uL (ref 0–200)
Basophils Relative: 0.4 %
Eosinophils Absolute: 40 cells/uL (ref 15–500)
Eosinophils Relative: 0.8 %
HCT: 41.1 % (ref 35.0–45.0)
Hemoglobin: 13.8 g/dL (ref 11.7–15.5)
Lymphs Abs: 1285 cells/uL (ref 850–3900)
MCH: 30.3 pg (ref 27.0–33.0)
MCHC: 33.6 g/dL (ref 32.0–36.0)
MCV: 90.3 fL (ref 80.0–100.0)
MPV: 9.3 fL (ref 7.5–12.5)
Monocytes Relative: 6.4 %
Neutro Abs: 3335 cells/uL (ref 1500–7800)
Neutrophils Relative %: 66.7 %
Platelets: 310 10*3/uL (ref 140–400)
RBC: 4.55 10*6/uL (ref 3.80–5.10)
RDW: 11.9 % (ref 11.0–15.0)
Total Lymphocyte: 25.7 %
WBC: 5 10*3/uL (ref 3.8–10.8)

## 2018-12-25 LAB — LIPID PANEL
Cholesterol: 198 mg/dL (ref <200)
HDL: 63 mg/dL (ref >=50)
LDL Cholesterol (Calc): 112 mg/dL (calc) — ABNORMAL HIGH
Non-HDL Cholesterol (Calc): 135 mg/dL (calc) — ABNORMAL HIGH (ref <130)
Total CHOL/HDL Ratio: 3.1 (calc) (ref <5.0)
Triglycerides: 115 mg/dL (ref <150)

## 2018-12-25 LAB — VITAMIN D 25 HYDROXY (VIT D DEFICIENCY, FRACTURES): Vit D, 25-Hydroxy: 17 ng/mL — ABNORMAL LOW (ref 30–100)

## 2018-12-25 LAB — MAGNESIUM: Magnesium: 2 mg/dL (ref 1.5–2.5)

## 2018-12-25 LAB — IRON, TOTAL/TOTAL IRON BINDING CAP
%SAT: 17 % (calc) (ref 16–45)
Iron: 65 ug/dL (ref 40–190)
TIBC: 378 mcg/dL (calc) (ref 250–450)

## 2018-12-25 LAB — VITAMIN B12: Vitamin B-12: 373 pg/mL (ref 200–1100)

## 2018-12-25 LAB — TSH: TSH: 2.01 mIU/L

## 2018-12-25 MED ORDER — VITAMIN D (ERGOCALCIFEROL) 1.25 MG (50000 UNIT) PO CAPS: ORAL_CAPSULE | ORAL | 1 refills | Status: DC

## 2019-01-26 ENCOUNTER — Other Ambulatory Visit: Payer: Self-pay | Admitting: Physician Assistant

## 2019-01-26 DIAGNOSIS — B351 Tinea unguium: Secondary | ICD-10-CM

## 2019-01-26 DIAGNOSIS — Z79899 Other long term (current) drug therapy: Secondary | ICD-10-CM

## 2019-01-26 MED ORDER — TERBINAFINE HCL 250 MG PO TABS: 250.0000 mg | ORAL_TABLET | Freq: Every day | ORAL | 0 refills | Status: AC

## 2019-03-05 DIAGNOSIS — Z79899 Other long term (current) drug therapy: Secondary | ICD-10-CM

## 2019-03-06 MED ORDER — TERBINAFINE HCL 250 MG PO TABS: 250.0000 mg | ORAL_TABLET | Freq: Every day | ORAL | 0 refills | Status: AC

## 2019-03-20 ENCOUNTER — Telehealth: Payer: Self-pay | Admitting: Cardiovascular Disease

## 2019-03-20 NOTE — Telephone Encounter (Signed)
Called patient but VM was full and could not leave a msg.

## 2019-03-20 NOTE — Telephone Encounter (Signed)
LVM for patient to call and schedule followup with Dr. Croitoru. °

## 2019-03-29 ENCOUNTER — Telehealth: Payer: Self-pay | Admitting: Cardiovascular Disease

## 2019-03-29 NOTE — Telephone Encounter (Signed)
LVM for patient to call and schedule 1 yr followup with Dr. Sallyanne Kuster.

## 2019-06-01 ENCOUNTER — Other Ambulatory Visit: Payer: Self-pay | Admitting: Adult Health

## 2019-06-04 ENCOUNTER — Other Ambulatory Visit: Payer: Self-pay

## 2019-06-04 ENCOUNTER — Ambulatory Visit: Payer: 59 | Attending: Internal Medicine

## 2019-06-04 DIAGNOSIS — Z20822 Contact with and (suspected) exposure to covid-19: Secondary | ICD-10-CM

## 2019-06-05 LAB — NOVEL CORONAVIRUS, NAA: SARS-CoV-2, NAA: NOT DETECTED

## 2019-06-11 ENCOUNTER — Ambulatory Visit: Payer: 59 | Attending: Internal Medicine

## 2019-12-27 NOTE — Progress Notes (Deleted)
Patient ID: Katrina Baker, female   DOB: Jan 18, 1996, 24 y.o.   MRN: [id number] COMPLETE PHYSICAL   Assessment and Plan  Encounter for general adult medical examination with abnormal findings 1 year  Obesity (BMI 30.0-34.9) - follow up 3 months for progress monitoring - increase veggies, decrease carbs - long discussion about weight loss, diet, and exercise  Vasovagal syncope Has not happened for a long time  PCOS (polycystic ovarian syndrome) -     TSH Continue metformin  Medication management -     CBC with Differential/Platelet -     COMPLETE METABOLIC PANEL WITH GFR -     Magnesium  Screening cholesterol level -     Lipid panel  Screening, anemia, deficiency, iron -     Vitamin B12 -     Iron,Total/Total Iron Binding Cap  Vitamin D deficiency -     VITAMIN D 25 Hydroxy (Vit-D Deficiency, Fractures)  Continue prudent diet as discussed, weight control, regular exercise, and medications. Routine screening labs and tests as requested with regular follow-up as recommended.  Over 40 minutes of exam, counseling, chart review and critical decision making was performed   Annual Screening Comprehensive Examination   This very nice 24 y.o.female presents for complete physical.  Patient has no major health issues.  Patient reports no complaints at this time.   Finally, patient has history of Vitamin D Deficiency and last vitamin D. Currently on supplementation.  She does have a history of PCOS.  She is a Clinical biochemist at [employer] and was suppose to do 4-5 months in [place], [place]. She was there 2 weeks and had to come home due to [place].  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    She is on metfomrin for PCOS (mostly for weight loss), she is established with Dr. Cruzita Lederer for this as well but not following.   BMI is There is no height or weight on file to calculate BMI., she is working on diet and exercise. Wt Readings from Last 3 Encounters:  12/24/18 171 lb 9.6 oz (77.8  kg)  06/19/18 174 lb 9.6 oz (79.2 kg)  04/27/18 177 lb (80.3 kg)   She does have a history of migraines, states has improved with ear piercing. .       Current Outpatient Medications on File Prior to Visit  Medication Sig Dispense Refill  . metFORMIN (GLUCOPHAGE) 500 MG tablet TAKE 1 TABLET (500 MG TOTAL) BY MOUTH 2 (TWO) TIMES DAILY WITH A MEAL. 60 tablet 5  . naproxen (NAPROSYN) 500 MG tablet Take 500 mg by mouth daily as needed.    . norgestimate-ethinyl estradiol (SPRINTEC 28) 0.25-35 MG-MCG tablet Take 1 tablet by mouth daily. 6 Package 0  . Vitamin D, Ergocalciferol, (DRISDOL) 1.25 MG (50000 UT) CAPS capsule 1 pill 2 days a week for vitamin d deficiency for 2 months 36 capsule 1   No current facility-administered medications on file prior to visit.   Immunization History  Administered Date(s) Administered  . Influenza Inj Mdck Quad Pf 04/12/2019    Flu shot at work Will get immunization records.   No LMP recorded. She is not sexually active, has never been.   Allergies No Known Allergies  SURGICAL HISTORY She  has no past surgical history on file.   FAMILY HISTORY Her family history includes COPD in her maternal grandmother; Cancer in her maternal aunt and paternal grandmother; Diabetes in her father and maternal grandfather; Heart Problems in her mother; High Cholesterol  in her father; High blood pressure in her father; Hypertension in her maternal grandfather.   SOCIAL HISTORY She  reports that she has never smoked. She has never used smokeless tobacco. She reports that she does not drink alcohol and does not use drugs.    Review of Systems  Constitutional: Negative for chills, fever and malaise/fatigue.  HENT: Negative for congestion, ear pain and sore throat.   Respiratory: Negative for cough, shortness of breath and wheezing.   Cardiovascular: Negative for chest pain, palpitations and leg swelling.  Gastrointestinal: Negative for abdominal pain, blood in stool,  constipation, diarrhea, heartburn and melena.  Genitourinary: Negative for dysuria, frequency, hematuria and urgency.  Skin: Negative.   Neurological: Positive for headaches. Negative for dizziness, sensory change and loss of consciousness.  Psychiatric/Behavioral: Negative for depression. The patient is not nervous/anxious and does not have insomnia.      Physical Exam  There were no vitals taken for this visit.  General Appearance: Well nourished and in no apparent distress. Eyes: PERRLA, EOMs, conjunctiva no swelling or erythema, normal fundi and vessels. Sinuses: No frontal/maxillary tenderness ENT/Mouth: EACs patent / TMs  nl. Nares clear without erythema, swelling, mucoid exudates. Oral hygiene is good. No erythema, swelling, or exudate. Tongue normal, non-obstructing. Tonsils not swollen or erythematous. Hearing normal.  Neck: Supple, thyroid normal. No bruits, nodes or JVD. Respiratory: Respiratory effort normal.  BS equal and clear bilateral without rales, rhonci, wheezing or stridor. Cardio: Heart sounds are normal with regular rate and rhythm and no murmurs, rubs or gallops. Peripheral pulses are normal and equal bilaterally without edema. No aortic or femoral bruits. Chest: symmetric with normal excursions and percussion..  Abdomen: Flat, soft, with bowl sounds. Nontender, no guarding, rebound, hernias, masses, or organomegaly.  Lymphatics: Non tender without lymphadenopathy.  Musculoskeletal: Full ROM all peripheral extremities, joint stability, 5/5 strength, and normal gait. Skin: Warm and dry without rashes, lesions, cyanosis, clubbing or  ecchymosis.  Neuro: Cranial nerves intact, reflexes equal bilaterally. Normal muscle tone, no cerebellar symptoms. Sensation intact.  Pysch: Awake and oriented X 3, normal affect, Insight and Judgment appropriate.

## 2019-12-30 ENCOUNTER — Encounter: Payer: 59 | Admitting: Physician Assistant

## 2020-03-13 ENCOUNTER — Other Ambulatory Visit: Payer: Self-pay

## 2020-03-13 ENCOUNTER — Ambulatory Visit (INDEPENDENT_AMBULATORY_CARE_PROVIDER_SITE_OTHER): Payer: 59

## 2020-03-13 DIAGNOSIS — Z23 Encounter for immunization: Secondary | ICD-10-CM | POA: Diagnosis not present

## 2020-03-25 ENCOUNTER — Encounter: Payer: Self-pay | Admitting: Podiatry

## 2020-03-25 ENCOUNTER — Ambulatory Visit: Payer: 59 | Admitting: Podiatry

## 2020-03-25 ENCOUNTER — Other Ambulatory Visit: Payer: Self-pay

## 2020-03-25 DIAGNOSIS — B351 Tinea unguium: Secondary | ICD-10-CM | POA: Diagnosis not present

## 2020-03-25 DIAGNOSIS — L603 Nail dystrophy: Secondary | ICD-10-CM

## 2020-03-25 DIAGNOSIS — A499 Bacterial infection, unspecified: Secondary | ICD-10-CM | POA: Diagnosis not present

## 2020-03-25 DIAGNOSIS — L608 Other nail disorders: Secondary | ICD-10-CM | POA: Diagnosis not present

## 2020-03-25 NOTE — Progress Notes (Signed)
°  Subjective:  Patient ID: Katrina Baker, female    DOB: 1996/01/29,  MRN: [id number]  Chief Complaint  Patient presents with   Nail Problem    24 y.o. female presents with the above complaint. History confirmed with patient.  She is at this present problem for many years.  She thinks she may have damaged the nail previously while playing soccer.  She is tried multiple treatments including a full course of Lamisil, prescription topical medications as well as an attempted an avulsion at the right side which regrew but then turned yellow and thick again.  Objective:  Physical Exam: warm, good capillary refill, no trophic changes or ulcerative lesions, normal DP and PT pulses and normal sensory exam.  Bilateral hallux nails are thick, yellow, and elongated, with subungual debris.  There are transverse Beau's lines present in both. Assessment:   1. Onychomycosis   2. Nail dystrophy      Plan:  Patient was evaluated and treated and all questions answered.   Discussed with her the etiology and treatments for onychomycosis.  Appears that she has quite resistant onychomycosis.  This could be explained by an atypical pathogen such as separate right small sore yeast.  Also possible that she has terbinafine resistant dermatophyte.  Recommend we culture and biopsy the nail today and this was sent to [facility] pathology and will be analyzed for PCR with terbinafine resistance.  If fungus is identified, I recommend that we treat with multimodal regimen of topical, oral, and laser therapy in combination.  If no fungus is identified, she will consider avulsion with permanent matricectomy of the hallux nails.  I will call her after I receive her test results and we will plan further treatment.  Lanae Crumbly, DPM 03/25/2020

## 2020-04-02 ENCOUNTER — Encounter: Payer: Self-pay | Admitting: *Deleted

## 2020-04-07 ENCOUNTER — Encounter: Payer: Self-pay | Admitting: Podiatry

## 2020-04-14 MED FILL — ZAFEMY 150-35 MCG/24HR PTWK: 150-35 | 28 days supply | Qty: 3 | Fill #0

## 2020-04-22 ENCOUNTER — Other Ambulatory Visit: Payer: Self-pay

## 2020-04-22 ENCOUNTER — Ambulatory Visit: Payer: 59 | Admitting: Adult Health

## 2020-04-22 ENCOUNTER — Encounter: Payer: Self-pay | Admitting: Adult Health

## 2020-04-22 VITALS — BP 134/78 | HR 84 | Temp 97.5°F | Wt 172.8 lb

## 2020-04-22 DIAGNOSIS — E785 Hyperlipidemia, unspecified: Secondary | ICD-10-CM | POA: Diagnosis not present

## 2020-04-22 DIAGNOSIS — E538 Deficiency of other specified B group vitamins: Secondary | ICD-10-CM | POA: Diagnosis not present

## 2020-04-22 DIAGNOSIS — Z1389 Encounter for screening for other disorder: Secondary | ICD-10-CM | POA: Diagnosis not present

## 2020-04-22 DIAGNOSIS — Z79899 Other long term (current) drug therapy: Secondary | ICD-10-CM | POA: Diagnosis not present

## 2020-04-22 DIAGNOSIS — E282 Polycystic ovarian syndrome: Secondary | ICD-10-CM | POA: Diagnosis not present

## 2020-04-22 DIAGNOSIS — Z0000 Encounter for general adult medical examination without abnormal findings: Secondary | ICD-10-CM

## 2020-04-22 DIAGNOSIS — E663 Overweight: Secondary | ICD-10-CM

## 2020-04-22 DIAGNOSIS — Z1329 Encounter for screening for other suspected endocrine disorder: Secondary | ICD-10-CM

## 2020-04-22 DIAGNOSIS — E559 Vitamin D deficiency, unspecified: Secondary | ICD-10-CM

## 2020-04-22 NOTE — Patient Instructions (Addendum)
  Katrina Baker , Thank you for taking time to come for your Annual Wellness Visit. I appreciate your ongoing commitment to your health goals. Please review the following plan we discussed and let me know if I can assist you in the future.   These are the goals we discussed: Goals    . Weight (lb) < 162 lb (73.5 kg)       This is a list of the screening recommended for you and due dates:  Health Maintenance  Topic Date Due  . Pap Smear  Never done  . COVID-19 Vaccine (2 - Pfizer 2-dose series) 04/03/2020  . Tetanus Vaccine  04/22/2021*  . Pap Smear  02/05/2023  . Flu Shot  Completed  .  Hepatitis C: One time screening is recommended by Center for Disease Control  (CDC) for  adults born from [birth year] through 1964.   Discontinued  . HIV Screening  Discontinued  *Topic was postponed. The date shown is not the original due date.       Know what a healthy weight is for you (roughly BMI <25) and aim to maintain this  Aim for 7+ servings of fruits and vegetables daily  65-80+ fluid ounces of water or unsweet tea for healthy kidneys  Limit to max 1 drink of alcohol per day; avoid smoking/tobacco  Limit animal fats in diet for cholesterol and heart health - choose grass fed whenever available  Avoid highly processed foods, and foods high in saturated/trans fats  Aim for low stress - take time to unwind and care for your mental health  Aim for 150 min of moderate intensity exercise weekly for heart health, and weights twice weekly for bone health  Aim for 7-9 hours of sleep daily      A great goal to work towards is aiming to get in a serving daily of some of the most nutritionally dense foods - G- BOMBS daily

## 2020-04-22 NOTE — Progress Notes (Signed)
Patient ID: Katrina Baker, female   DOB: 03-16-1996, 24 y.o.   MRN: [id number] COMPLETE PHYSICAL   Assessment and Plan  Encounter for general adult medical examination with abnormal findings 1 year Check with peds for immunization record  Overweight Long discussion about weight loss, diet, and exercise Recommended diet heavy in fruits and veggies and low in animal meats, cheeses, and dairy products, appropriate calorie intake Discussed appropriate weight for height  Follow up at next visit   B12 deficiency      -      Vitamin B12  PCOS (polycystic ovarian syndrome) GYN follows, improved on HBC patch; monitor   Medication management -     CBC with Differential/Platelet -     COMPLETE METABOLIC PANEL WITH GFR  Hyperlipidemia Continue low cholesterol diet and exercise.  Check lipid panel.  -     Lipid panel -     TSH  Vitamin D deficiency -     VITAMIN D 25 Hydroxy (Vit-D Deficiency, Fractures)  Continue prudent diet as discussed, weight control, regular exercise, and medications. Routine screening labs and tests as requested with regular follow-up as recommended.  Over 40 minutes of exam, counseling, chart review and critical decision making was performed  Future Appointments  Date Time Provider [place]  04/23/2021  9:30 AM Liane Comber, NP [facility] None      Annual Screening Comprehensive Examination   This very nice 24 y.o.female presents for complete physical. She has PCOS (polycystic ovarian syndrome); Obesity (BMI 30.0-34.9); Onychomycosis; and B12 deficiency on their problem list.   Patient reports no complaints at this time.   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   Single and loving it -   She does have a history of PCOS, diagnosed when she was 7. She [facility] Off  of metformin, made her feel bad and didn't help, now on xulane weekly patch.   Saw podiatry for possible onychomycosis, pending test results for treatment -   BMI is Body mass index is 29.66 kg/m., she is working on diet, cutting out processed, dairy intake. Trying to increase fruits and veggies, not sure, often forgets to eat She is up and moving a lot at her work, does strength training a few days a week.  Water: 65+ most days, minimal caffeine, does decaf Alcohol socially, rare Sleep - stays up too late decompressing Wt Readings from Last 3 Encounters:  04/22/20 172 lb 12.8 oz (78.4 kg)  12/24/18 171 lb 9.6 oz (77.8 kg)  06/19/18 174 lb 9.6 oz (79.2 kg)   She does have a history of migraines, states has improved with ear piercing. .    Today their BP is BP: 134/78  She does workout. She denies chest pain, shortness of breath, dizziness. Hx of vasovagal syncope, none in last 3 years.    She is not on cholesterol medication and denies myalgias. Her cholesterol is not at goal. The cholesterol last visit was:   Lab Results  Component Value Date   CHOL 198 12/24/2018   HDL 63 12/24/2018   LDLCALC 112 (H) 12/24/2018   TRIG 115 12/24/2018   CHOLHDL 3.1 12/24/2018    She has been working on diet and exercise for glucose management, and denies increased appetite, nausea, paresthesia of the feet, polydipsia, polyuria and visual disturbances. Last  A1C in the office was:  Lab Results  Component Value Date   HGBA1C 5.0 03/28/2016   Patient is on Vitamin D supplement, takes 1000 IU but forgets  Lab Results  Component Value Date   VD25OH 17 (L) 12/24/2018     She takes 1000 mcg tablet, admits forgets to take Lab Results  Component Value Date   VITAMINB12 373 12/24/2018       Current Outpatient Medications on File Prior to Visit  Medication Sig Dispense Refill   cholecalciferol (VITAMIN D3) 25 MCG (1000 UNIT) tablet Take 1,000 Units by mouth daily.     cyanocobalamin 1000 MCG  tablet Vitamin B-12  1,000 mcg tablet  Take by oral route.     naproxen (NAPROSYN) 500 MG tablet Take 500 mg by mouth daily as needed.     XULANE 150-35 MCG/24HR transdermal patch 1 patch once a week.     No current facility-administered medications on file prior to visit.   Immunization History  Administered Date(s) Administered   Influenza Inj Mdck Quad Pf 04/12/2019   Influenza-Unspecified 03/14/2017   PFIZER SARS-COV-2 Vaccination 03/13/2020    Will get immunization records.   Tetanus: unsure when but was UTD for college  Flu: 03/2020 HPV: had as a teen  Covid 22: has had 3/3, 2021, pfizer - will send infor  PAP - has annually by GYN, last 2021  Last eye: 2021, no issues  Last dental: 2021, goes q80m  No LMP recorded. Never sexually active   Allergies No Known Allergies  SURGICAL HISTORY She  has no past surgical history on file.   FAMILY HISTORY Her family history includes COPD in her maternal grandmother; Cancer in her maternal aunt and paternal grandmother; Diabetes in her father and maternal grandfather; Heart Problems in her mother; High Cholesterol in her father; High blood pressure in her father; Hypertension in her maternal grandfather.   SOCIAL HISTORY She  reports that she has never smoked. She has never used smokeless tobacco. She reports that she does not drink alcohol and does not use drugs.    Review of Systems  Constitutional: Negative for chills, fever and malaise/fatigue.  HENT: Negative for congestion, ear pain and sore throat.   Respiratory: Negative for cough, shortness of breath and wheezing.   Cardiovascular: Negative for chest pain, palpitations and leg swelling.  Gastrointestinal: Negative for abdominal pain, blood in stool, constipation, diarrhea, heartburn and melena.  Genitourinary: Negative for dysuria, frequency, hematuria and urgency.  Skin: Negative.   Neurological: Positive for headaches (rare, L sided migraines). Negative for  dizziness, sensory change and loss of consciousness.  Psychiatric/Behavioral: Negative for depression. The patient is not nervous/anxious and does not have insomnia.      Physical Exam  BP 134/78    Pulse 84    Temp (!) 97.5 F (36.4 C)    Wt 172 lb 12.8 oz (78.4 kg)    SpO2 99%    BMI 29.66 kg/m   General Appearance: Well nourished and in no apparent distress. Eyes: PERRLA, EOMs, conjunctiva no swelling or erythema, normal fundi and vessels. Sinuses: No frontal/maxillary tenderness ENT/Mouth: EACs patent / TMs  nl. Nares clear without erythema, swelling, mucoid exudates. Oral hygiene is good. No erythema, swelling, or exudate. Tongue normal, non-obstructing. Tonsils not swollen or erythematous. Hearing normal.  Neck: Supple, thyroid normal. No bruits, nodes or JVD. Respiratory: Respiratory effort normal.  BS equal and clear bilateral without rales, rhonci, wheezing or stridor. Cardio: Heart sounds are normal with regular  rate and rhythm and no murmurs, rubs or gallops. Peripheral pulses are normal and equal bilaterally without edema. No aortic or femoral bruits. Chest: symmetric with normal excursions and percussion..  Abdomen: Flat, soft, with bowl sounds. Nontender, no guarding, rebound, hernias, masses, or organomegaly.  Lymphatics: Non tender without lymphadenopathy.  Musculoskeletal: Full ROM all peripheral extremities, joint stability, 5/5 strength, and normal gait. Skin: Warm and dry without rashes, lesions, cyanosis, clubbing or  ecchymosis. Bil great toenails thickened and yellow  Neuro: Cranial nerves intact, reflexes equal bilaterally. Normal muscle tone, no cerebellar symptoms. Sensation intact.  Pysch: Awake and oriented X 3, normal affect, Insight and Judgment appropriate.  Breast/GU: deferred to [place], NP 3:20 PM [facility]

## 2020-04-23 LAB — COMPLETE METABOLIC PANEL WITH GFR
AG Ratio: 1.6 (calc) (ref 1.0–2.5)
ALT: 12 U/L (ref 6–29)
AST: 15 U/L (ref 10–30)
Albumin: 4.5 g/dL (ref 3.6–5.1)
Alkaline phosphatase (APISO): 62 U/L (ref 31–125)
BUN: 8 mg/dL (ref 7–25)
CO2: 24 mmol/L (ref 20–32)
Calcium: 9.5 mg/dL (ref 8.6–10.2)
Chloride: 103 mmol/L (ref 98–110)
Creat: 0.81 mg/dL (ref 0.50–1.10)
GFR, Est African American: 119 mL/min/{1.73_m2} (ref >=60)
GFR, Est Non African American: 102 mL/min/{1.73_m2} (ref >=60)
Globulin: 2.8 g/dL (calc) (ref 1.9–3.7)
Glucose, Bld: 76 mg/dL (ref 65–99)
Potassium: 4.7 mmol/L (ref 3.5–5.3)
Sodium: 137 mmol/L (ref 135–146)
Total Bilirubin: 0.4 mg/dL (ref 0.2–1.2)
Total Protein: 7.3 g/dL (ref 6.1–8.1)

## 2020-04-23 LAB — CBC WITH DIFFERENTIAL/PLATELET
Absolute Monocytes: 500 cells/uL (ref 200–950)
Basophils Absolute: 16 cells/uL (ref 0–200)
Basophils Relative: 0.2 %
Eosinophils Absolute: 49 cells/uL (ref 15–500)
Eosinophils Relative: 0.6 %
HCT: 41.5 % (ref 35.0–45.0)
Hemoglobin: 14.2 g/dL (ref 11.7–15.5)
Lymphs Abs: 2386 cells/uL (ref 850–3900)
MCH: 30.8 pg (ref 27.0–33.0)
MCHC: 34.2 g/dL (ref 32.0–36.0)
MCV: 90 fL (ref 80.0–100.0)
MPV: 9.7 fL (ref 7.5–12.5)
Monocytes Relative: 6.1 %
Neutro Abs: 5248 cells/uL (ref 1500–7800)
Neutrophils Relative %: 64 %
Platelets: 302 10*3/uL (ref 140–400)
RBC: 4.61 10*6/uL (ref 3.80–5.10)
RDW: 12.1 % (ref 11.0–15.0)
Total Lymphocyte: 29.1 %
WBC: 8.2 10*3/uL (ref 3.8–10.8)

## 2020-04-23 LAB — VITAMIN D 25 HYDROXY (VIT D DEFICIENCY, FRACTURES): Vit D, 25-Hydroxy: 22 ng/mL — ABNORMAL LOW (ref 30–100)

## 2020-04-23 LAB — LIPID PANEL
Cholesterol: 192 mg/dL (ref <200)
HDL: 70 mg/dL (ref >=50)
LDL Cholesterol (Calc): 103 mg/dL (calc) — ABNORMAL HIGH
Non-HDL Cholesterol (Calc): 122 mg/dL (calc) (ref <130)
Total CHOL/HDL Ratio: 2.7 (calc) (ref <5.0)
Triglycerides: 95 mg/dL (ref <150)

## 2020-04-23 LAB — URINALYSIS, ROUTINE W REFLEX MICROSCOPIC
Bilirubin Urine: NEGATIVE
Glucose, UA: NEGATIVE
Hgb urine dipstick: NEGATIVE
Ketones, ur: NEGATIVE
Leukocytes,Ua: NEGATIVE
Nitrite: NEGATIVE
Protein, ur: NEGATIVE
Specific Gravity, Urine: 1.011 (ref 1.001–1.03)
pH: 5.5 (ref 5.0–8.0)

## 2020-04-23 LAB — VITAMIN B12: Vitamin B-12: 300 pg/mL (ref 200–1100)

## 2020-04-23 LAB — TSH: TSH: 2.98 mIU/L

## 2020-04-27 ENCOUNTER — Encounter: Payer: Self-pay | Admitting: *Deleted

## 2020-05-10 ENCOUNTER — Encounter: Payer: Self-pay | Admitting: Podiatry

## 2020-05-11 MED FILL — ZAFEMY 150-35 MCG/24HR PTWK: 150-35 | 28 days supply | Qty: 3 | Fill #1

## 2020-06-02 MED FILL — XULANE PATCH: 150-35 | 28 days supply | Qty: 3 | Fill #2

## 2020-06-21 ENCOUNTER — Other Ambulatory Visit: Payer: Self-pay

## 2020-06-21 DIAGNOSIS — Z20822 Contact with and (suspected) exposure to covid-19: Secondary | ICD-10-CM | POA: Diagnosis not present

## 2020-06-23 ENCOUNTER — Telehealth: Payer: 59 | Admitting: Family

## 2020-06-23 ENCOUNTER — Other Ambulatory Visit: Payer: Self-pay | Admitting: Nurse Practitioner

## 2020-06-23 DIAGNOSIS — R059 Cough, unspecified: Secondary | ICD-10-CM | POA: Diagnosis not present

## 2020-06-23 LAB — NOVEL CORONAVIRUS, NAA: SARS-CoV-2, NAA: NOT DETECTED

## 2020-06-23 LAB — SARS-COV-2, NAA 2 DAY TAT

## 2020-06-23 MED ORDER — AZITHROMYCIN 250 MG PO TABS: ORAL_TABLET | ORAL | 0 refills | Status: DC

## 2020-06-23 MED ORDER — BENZONATATE 100 MG PO CAPS: 100.0000 mg | ORAL_CAPSULE | Freq: Three times a day (TID) | ORAL | 0 refills | Status: DC | PRN

## 2020-06-23 NOTE — Progress Notes (Signed)
We are sorry that you are not feeling well.  Here is how we plan to help!  Based on your presentation I believe you most likely have A cough due to bacteria.  When patients have a fever and a productive cough with a change in color or increased sputum production, we are concerned about bacterial bronchitis.  If left untreated it can progress to pneumonia.  If your symptoms do not improve with your treatment plan it is important that you contact your provider.   I have prescribed Azithromyin 250 mg: two tablets now and then one tablet daily for 4 additonal days    In addition you may use A non-prescription cough medication called Robitussin DAC. Take 2 teaspoons every 8 hours or Delsym: take 2 teaspoons every 12 hours., A non-prescription cough medication called Mucinex DM: take 2 tablets every 12 hours. and A prescription cough medication called Tessalon Perles 100mg. You may take 1-2 capsules every 8 hours as needed for your cough.    From your responses in the eVisit questionnaire you describe inflammation in the upper respiratory tract which is causing a significant cough.  This is commonly called Bronchitis and has four common causes:    Allergies  Viral Infections  Acid Reflux  Bacterial Infection Allergies, viruses and acid reflux are treated by controlling symptoms or eliminating the cause. An example might be a cough caused by taking certain blood pressure medications. You stop the cough by changing the medication. Another example might be a cough caused by acid reflux. Controlling the reflux helps control the cough.  USE OF BRONCHODILATOR ("RESCUE") INHALERS: There is a risk from using your bronchodilator too frequently.  The risk is that over-reliance on a medication which only relaxes the muscles surrounding the breathing tubes can reduce the effectiveness of medications prescribed to reduce swelling and congestion of the tubes themselves.  Although you feel brief relief from the  bronchodilator inhaler, your asthma may actually be worsening with the tubes becoming more swollen and filled with mucus.  This can delay other crucial treatments, such as oral steroid medications. If you need to use a bronchodilator inhaler daily, several times per day, you should discuss this with your provider.  There are probably better treatments that could be used to keep your asthma under control.     HOME CARE . Only take medications as instructed by your medical team. . Complete the entire course of an antibiotic. . Drink plenty of fluids and get plenty of rest. . Avoid close contacts especially the very young and the elderly . Cover your mouth if you cough or cough into your sleeve. . Always remember to wash your hands . A steam or ultrasonic humidifier can help congestion.   GET HELP RIGHT AWAY IF: . You develop worsening fever. . You become short of breath . You cough up blood. . Your symptoms persist after you have completed your treatment plan MAKE SURE YOU   Understand these instructions.  Will watch your condition.  Will get help right away if you are not doing well or get worse.  Your e-visit answers were reviewed by a board certified advanced clinical practitioner to complete your personal care plan.  Depending on the condition, your plan could have included both over the counter or prescription medications. If there is a problem please reply  once you have received a response from your provider. Your safety is important to us.  If you have drug allergies check your prescription carefully.      You can use MyChart to ask questions about today's visit, request a non-urgent call back, or ask for a work or school excuse for 24 hours related to this e-Visit. If it has been greater than 24 hours you will need to follow up with your provider, or enter a new e-Visit to address those concerns. You will get an e-mail in the next two days asking about your experience.  I hope that  your e-visit has been valuable and will speed your recovery. Thank you for using e-visits.  Approximately 5 minutes was spent documenting and reviewing patient's chart.   

## 2020-06-23 NOTE — Addendum Note (Signed)
Addended by: Chevis Pretty on: 06/23/2020 04:25 PM   Modules accepted: Orders

## 2020-06-30 MED FILL — XULANE PATCH: 150-35 | 28 days supply | Qty: 3 | Fill #3

## 2020-07-01 ENCOUNTER — Other Ambulatory Visit: Payer: Self-pay | Admitting: Adult Health

## 2020-07-01 MED ORDER — PROMETHAZINE-DM 6.25-15 MG/5ML PO SYRP: 5.0000 mL | ORAL_SOLUTION | Freq: Four times a day (QID) | ORAL | 1 refills | Status: DC | PRN

## 2020-07-01 MED FILL — PROMETHAZINE W/DM SYRUP: 6.25-15 | 12 days supply | Qty: 240 | Fill #0

## 2020-07-15 ENCOUNTER — Ambulatory Visit (INDEPENDENT_AMBULATORY_CARE_PROVIDER_SITE_OTHER): Payer: 59 | Admitting: Adult Health

## 2020-07-15 ENCOUNTER — Other Ambulatory Visit: Payer: Self-pay

## 2020-07-15 ENCOUNTER — Encounter: Payer: Self-pay | Admitting: Adult Health

## 2020-07-15 ENCOUNTER — Other Ambulatory Visit: Payer: Self-pay | Admitting: Adult Health

## 2020-07-15 ENCOUNTER — Ambulatory Visit
Admission: RE | Admit: 2020-07-15 | Discharge: 2020-07-15 | Disposition: A | Discharge status: Home / Self Care | Payer: 59 | Source: Ambulatory Visit | Attending: Adult Health | Admitting: Adult Health

## 2020-07-15 VITALS — BP 136/74 | HR 94 | Temp 97.4°F | Wt 173.0 lb

## 2020-07-15 DIAGNOSIS — R058 Other specified cough: Secondary | ICD-10-CM | POA: Diagnosis not present

## 2020-07-15 DIAGNOSIS — Z1152 Encounter for screening for COVID-19: Secondary | ICD-10-CM | POA: Diagnosis not present

## 2020-07-15 DIAGNOSIS — R059 Cough, unspecified: Secondary | ICD-10-CM | POA: Diagnosis not present

## 2020-07-15 LAB — POC COVID19 BINAXNOW: SARS Coronavirus 2 Ag: NEGATIVE

## 2020-07-15 MED ORDER — PREDNISONE 20 MG PO TABS: ORAL_TABLET | ORAL | 0 refills | Status: DC

## 2020-07-15 MED ORDER — ALBUTEROL SULFATE HFA 108 (90 BASE) MCG/ACT IN AERS: 2.0000 | INHALATION_SPRAY | RESPIRATORY_TRACT | 0 refills | Status: DC | PRN

## 2020-07-15 MED FILL — ALBUTEROL SULFATE HFA 108 (: 108 (90 BAS | 16 days supply | Qty: 18 | Fill #0

## 2020-07-15 MED FILL — predniSONE 20 MG TABS: 20 | 7 days supply | Qty: 10 | Fill #0

## 2020-07-15 NOTE — Progress Notes (Signed)
Assessment and Plan:  Katrina Baker was seen today for acute visit.  Diagnoses and all orders for this visit:  Productive cough With new intermittent fever and increased productivity reported x 3 days Will check CXR to r/o walking pneumonia - persistent and progressive sx Otherwise fairly benign exam excepting frequent hacking spastic/bronchitic sounding cough If CXR neg pneumonia, given albuterol and breo inhaler sample, steroid taper to start for possible mild asthmatic/reactive airway/bronchitis Discussed importance of avoiding unnecessary abx Suggested symptomatic OTC remedies.  Nasal saline spray for congestion. Get on allergy pill  Follow up if not improving in next 3 days with discussed plan or with new or worsening sx -     DG Chest 2 View; Future -     predniSONE (DELTASONE) 20 MG tablet; 2 tablets daily for 3 days, 1 tablet daily for 4 days. -     albuterol (VENTOLIN HFA) 108 (90 Base) MCG/ACT inhaler; Inhale 2 puffs into the lungs every 4 (four) hours as needed for wheezing or shortness of breath. Please give generic or the one that insurance covers  Encounter for screening for COVID-19 -     POC COVID-19 - negative   Further disposition pending results of labs. Discussed med's effects and SE's.   Over 30 minutes of exam, counseling, chart review, and critical decision making was performed.   Future Appointments  Date Time Provider [place]  04/23/2021  9:30 AM Liane Comber, NP [facility] None    ------------------------------------------------------------------------------------------------------------------   HPI BP 136/74   Pulse 94   Temp (!) 97.4 F (36.3 C)   Wt 173 lb (78.5 kg)   SpO2 99%   BMI 29.70 kg/m   25 y.o.female with hx of allergies and exercise induced asthma presents for URI. She had negative rapid covid 19 test today.   Reports sx began about 1 month ago, URI sx with ocugh and congestion, sneezing, saw teledoc and was given zpak and  tessalon without significant improvement; promethazine DM was prescribed and reports has been more helpful but continues to have intermittent cough, nasal congestion, intermittent sinus pain, persistent cough. Reports has had 4 covid 19 tests since onset of sx.   She reports AM sx are improved, starts having increasing sx of congestion towards the afternoon; continues to have frequent coughing, worse with talking more. Denies wheezing, dyspnea. Only some chest aching if has coughed a lot. She notes new low grade fever intermittently in the afternoons/evenings in the last 3 days. Also reports more productive of yellow phlegm.   She reports has been taking guaifenesin from her mom that has helped some. Has also tried tylenol cold/flu.   She typically has early spring/late fall allergies; she is not currently taking antihistamine; denies new home/work/pets. Denies watery itchy eyes or sneezing execpt at very beginning.   She reports hx of exercise induced asthma in childhood; no issues recently.    Past Medical History:  Diagnosis Date  . Allergy   . Vasovagal syncope 06/16/2016     No Known Allergies  Current Outpatient Medications on File Prior to Visit  Medication Sig  . cholecalciferol (VITAMIN D3) 25 MCG (1000 UNIT) tablet Take 1,000 Units by mouth daily.  . cyanocobalamin 1000 MCG tablet Vitamin B-12  1,000 mcg tablet  Take by oral route.  . naproxen (NAPROSYN) 500 MG tablet Take 500 mg by mouth daily as needed.  . promethazine-dextromethorphan (PROMETHAZINE-DM) 6.25-15 MG/5ML syrup Take 5 mLs by mouth 4 (four) times daily as needed for cough.  Marland Kitchen  XULANE 150-35 MCG/24HR transdermal patch 1 patch once a week.  Marland Kitchen azithromycin (ZITHROMAX) 250 MG tablet Take 500 mg once, then 250 mg for four days  . benzonatate (TESSALON PERLES) 100 MG capsule Take 1 capsule (100 mg total) by mouth 3 (three) times daily as needed. (Patient not taking: Reported on 07/15/2020)   No current facility-administered  medications on file prior to visit.    ROS: all negative except above.   Physical Exam:  BP 136/74   Pulse 94   Temp (!) 97.4 F (36.3 C)   Wt 173 lb (78.5 kg)   SpO2 99%   BMI 29.70 kg/m   General Appearance: Well nourished, well dressed young adult female in no apparent distress. Eyes: PERRLA,  conjunctiva no swelling or erythema Sinuses: No Frontal/maxillary tenderness ENT/Mouth: Ext aud canals clear, TMs without erythema, bulging. No erythema, swelling, or exudate on post pharynx.  Tonsils not swollen or erythematous. Hearing normal.  Neck: Supple, thyroid normal.  Respiratory: Respiratory effort normal, BS equal bilaterally without rales, rhonchi, wheezing or stridor. Speaking in complete sentences without distress but interrupted by frequent hacking cough. Coughs with deep breaths.  Cardio: RRR with no MRGs, bounding. Brisk peripheral pulses without edema.   Abdomen: Soft, + BS.  Non tender. Lymphatics: Non tender without lymphadenopathy.  Musculoskeletal: Symmetrical strength, normal gait.  Skin: Warm, dry without rashes, lesions, ecchymosis.  Neuro: Cranial nerves intact. Normal muscle tone Psych: Awake and oriented X 3, normal affect, Insight and Judgment appropriate.     Izora Ribas, NP 3:18 PM [facility]

## 2020-07-15 NOTE — Patient Instructions (Addendum)
[facility] - chest xray    Try breo - 1 puff once daily, rinse mouth, brush teeth and gargle afterwards If this is helping a lot but still some breakthrough cough -pick up albuterol inhaler  Get on daily allergy medication   If still congested add saline nasal sprays if needed  If congestion comes back after prednisone - add flonase 1-2 sprays each nostril 1-2 times a day    -There are a lot of combination medications (Dayquil, Nyquil, Vicks 44, tyelnol cold and sinus, ETC). Please look at the ingredients on the back so that you are treating the correct symptoms and not doubling up on medications/ingredients.    Medicines you can use  Nasal congestion  Little Remedies saline spray (aerosol/mist)- can try this, it is in the kids section - pseudoephedrine (Sudafed)- behind the counter, do not use if you have high blood pressure, medicine that have -D in them.  - phenylephrine (Sudafed PE) -Dextormethorphan + chlorpheniramine (Coridcidin HBP)- okay if you have high blood pressure -Oxymetazoline (Afrin) nasal spray- LIMIT to 3 days -Saline nasal spray -Neti pot (used distilled or bottled water)  Ear pain/congestion  -pseudoephedrine (sudafed) - Nasonex/flonase nasal spray  Fever  -Acetaminophen (Tyelnol) -Ibuprofen (Advil, motrin, aleve)  Sore Throat  -Acetaminophen (Tyelnol) -Ibuprofen (Advil, motrin, aleve) -Drink a lot of water -Gargle with salt water - Rest your voice (don't talk) -Throat sprays -Cough drops  Body Aches  -Acetaminophen (Tyelnol) -Ibuprofen (Advil, motrin, aleve)  Headache  -Acetaminophen (Tyelnol) -Ibuprofen (Advil, motrin, aleve) - Exedrin, Exedrin Migraine  Allergy symptoms (cough, sneeze, runny nose, itchy eyes) -Claritin or loratadine cheapest but likely the weakest  -Zyrtec or certizine at night because it can make you sleepy -The strongest is allegra or fexafinadine  Cheapest at [employer], [employer],  [employer]  Cough  -Dextromethorphan (Delsym)- medicine that has DM in it -Guafenesin (Mucinex/Robitussin) - cough drops - drink lots of water  Chest Congestion  -Guafenesin (Mucinex/Robitussin)  Red Itchy Eyes  - Naphcon-A  Upset Stomach  - Bland diet (nothing spicy, greasy, fried, and high acid foods like tomatoes, oranges, berries) -OKAY- cereal, bread, soup, crackers, rice -Eat smaller more frequent meals -reduce caffeine, no alcohol -Loperamide (Imodium-AD) if diarrhea -Prevacid for heart burn  General health when sick  -Hydration -wash your hands frequently -keep surfaces clean -change pillow cases and sheets often -Get fresh air but do not exercise strenuously -Vitamin D, double up on it - Vitamin C -Zinc     Fluticasone; Vilanterol inhalation powder What is this medicine? FLUTICASONE; VILANTEROL (floo TIK a sone; vye LAN ter ol) inhalation is a combination of two medicines that decrease inflammation and help to open up the airways of your lungs. It is for chronic obstructive pulmonary disease (COPD), including chronic bronchitis or emphysema. It is also used for asthma in adults to help control symptoms. Do NOT use for an acute asthma attack or COPD attack. This medicine may be used for other purposes; ask your health care provider or pharmacist if you have questions. COMMON BRAND NAME(S): BREO ELLIPTA What should I tell my health care provider before I take this medicine? They need to know if you have any of these conditions:  bone problems  diabetes  eye disease, vision problems  immune system problems  heart disease or irregular heartbeat  high blood pressure  infection  pheochromocytoma  seizures  thyroid disease  an unusual or allergic reaction to fluticasone, vilanterol, milk proteins, corticosteroids, other medicines, foods, dyes,  or preservatives  pregnant or trying to get pregnant  breast-feeding How should I use this medicine? This  medicine is inhaled through the mouth. It is used once per day. Follow the directions on the prescription label. Do not use a spacer device with this inhaler. Take your medicine at regular intervals. Do not take your medicine more often than directed. Do not stop taking except on your doctor's advice. Make sure that you are using your inhaler correctly. Ask you doctor or health care provider if you have any questions. A special MedGuide will be given to you by the pharmacist with each prescription and refill. Be sure to read this information carefully each time. Talk to your pediatrician regarding the use of this medicine in children. Special care may be needed. This medicine is not approved for use in children under 44 years of age. Overdosage: If you think you have taken too much of this medicine contact a poison control center or emergency room at once. NOTE: This medicine is only for you. Do not share this medicine with others. What if I miss a dose? If you miss a dose, use it as soon as you can. If it is almost time for your next dose, use only that dose and continue with your regular schedule. Do not use double or extra doses. What may interact with this medicine? Do not take this medicine with any of the following medications:  cisapride  dofetilide  dronedarone  MAOIs like Carbex, Eldepryl, Marplan, Nardil, and Parnate  pimozide  thioridazine  ziprasidone This medicine may also interact with the following medications:  antiviral medicines for HIV or AIDS  beta-blockers like metoprolol and propranolol  certain medicines for depression, anxiety, or psychotic disturbances  certain medicines for fungal infections like ketoconazole, itraconazole, posaconazole, voriconazole  conivaptan  diuretics  medicines for colds  nefazodone  other medicines for breathing problems  other medicines that prolong the QT interval (cause an abnormal heart rhythm) This list may not  describe all possible interactions. Give your health care provider a list of all the medicines, herbs, non-prescription drugs, or dietary supplements you use. Also tell them if you smoke, drink alcohol, or use illegal drugs. Some items may interact with your medicine. What should I watch for while using this medicine? Visit your doctor or health care professional for regular checkups. Tell your doctor or health care professional if your symptoms do not get better. Do not use this medicine more than once every 24 hours. NEVER use this medicine for an acute asthma or COPD attack. You should use your short-acting rescue inhalers for this purpose. If your symptoms get worse or if you need your short-acting inhalers more often, call your doctor right away. If you are going to have surgery tell your doctor or health care professional that you are using this medicine. Try not to come in contact with people with the chicken pox or measles. If you do, call your doctor. This medicine may increase blood sugar. Ask your healthcare provider if changes in diet or medicines are needed if you have diabetes. What side effects may I notice from receiving this medicine? Side effects that you should report to your doctor or health care professional as soon as possible:  allergic reactions like skin rash or hives, swelling of the face, lips, or tongue  breathing problems right after inhaling your medicine  chest pain  fast, irregular heartbeat  feeling faint or lightheaded, falls  fever or chills  nausea, vomiting  signs and symptoms of high blood sugar such as being more thirsty or hungry or having to urinate more than normal. You may also feel very tired or have blurry vision. Side effects that usually do not require medical attention (report to your doctor or health care professional if they continue or are bothersome):  cough  headache  nervousness  sore throat  tremor This list may not describe  all possible side effects. Call your doctor for medical advice about side effects. You may report side effects to FDA at 1-800-FDA-1088. Where should I keep my medicine? Keep out of the reach of children. Store at room temperature between 15 and 30 degrees C (59 and 86 degrees F). Store in a dry place away from direct heat or sunlight. Throw away 6 weeks after you remove the inhaler from the foil tray, or after the dose indicator reads 0, whichever comes first. Throw away any unopened packages after the expiration date. NOTE: This sheet is a summary. It may not cover all possible information. If you have questions about this medicine, talk to your doctor, pharmacist, or health care provider.  2021 Elsevier/Gold Standard (2018-02-28 11:19:39)

## 2020-07-28 ENCOUNTER — Other Ambulatory Visit (HOSPITAL_COMMUNITY): Payer: Self-pay | Admitting: Obstetrics and Gynecology

## 2020-07-28 MED FILL — XULANE PATCH: 150-35 | 28 days supply | Qty: 3 | Fill #0

## 2020-09-02 MED FILL — XULANE PATCH: 150-35 | 28 days supply | Qty: 3 | Fill #1

## 2020-09-28 ENCOUNTER — Other Ambulatory Visit (HOSPITAL_COMMUNITY): Payer: Self-pay

## 2020-09-28 ENCOUNTER — Other Ambulatory Visit: Payer: Self-pay

## 2020-09-28 ENCOUNTER — Encounter: Payer: Self-pay | Admitting: Podiatry

## 2020-09-28 ENCOUNTER — Ambulatory Visit: Payer: 59 | Admitting: Podiatry

## 2020-09-28 DIAGNOSIS — L603 Nail dystrophy: Secondary | ICD-10-CM | POA: Diagnosis not present

## 2020-09-28 MED ORDER — NEOMYCIN-POLYMYXIN-HC 3.5-10000-1 OT SUSP
OTIC | 0 refills | Status: DC
  Filled 2020-09-28: qty 10, 30d supply, fill #0

## 2020-09-28 NOTE — Progress Notes (Signed)
  Subjective:  Patient ID: Katrina Baker, female    DOB: Mar 31, 1996,  MRN: [id number]  Chief Complaint  Patient presents with  . Nail Problem    Patient presents today for permanent toenail removal of both great toenails    25 y.o. female returns with the above complaint. History confirmed with patient.  She would like the nails removed finally  Objective:  Physical Exam: warm, good capillary refill, no trophic changes or ulcerative lesions, normal DP and PT pulses and normal sensory exam.  Bilateral hallux nails are thick, yellow, and elongated, with subungual debris.  There are transverse Beau's lines present in both. Assessment:   No diagnosis found.   Plan:  Patient was evaluated and treated and all questions answered.     Dystrophic Nail, bilaterally -Patient elects to proceed with minor surgery to remove dystrophic toenail today. Consent reviewed and signed by patient. -Dystrophic nail excised. See procedure note. -Educated on post-procedure care including soaking. Written instructions provided and reviewed. -Patient to follow up in 2 weeks for nail check.  Procedure: Excision of dystrophic toenail Location: Bilateral 1st toe  nail . Anesthesia: Lidocaine 1% plain; 1.5 mL and Marcaine 0.5% plain; 1.5 mL, digital block. Skin Prep: Betadine. Dressing: Silvadene; telfa; dry, sterile, compression dressing. Technique: Following skin prep, the toe was exsanguinated and a tourniquet was secured at the base of the toe. The affected nail was freed, and excised. Chemical matrixectomy was then performed with phenol and irrigated out with alcohol. The tourniquet was then removed and sterile dressing applied. Disposition: Patient tolerated procedure well. Patient to return in 2 weeks for follow-up.

## 2020-09-28 NOTE — Patient Instructions (Signed)

## 2020-10-02 ENCOUNTER — Other Ambulatory Visit: Payer: Self-pay | Admitting: Obstetrics and Gynecology

## 2020-10-02 ENCOUNTER — Other Ambulatory Visit (HOSPITAL_COMMUNITY): Payer: Self-pay

## 2020-10-05 ENCOUNTER — Other Ambulatory Visit (HOSPITAL_COMMUNITY): Payer: Self-pay

## 2020-10-06 ENCOUNTER — Other Ambulatory Visit (HOSPITAL_COMMUNITY): Payer: Self-pay

## 2020-10-06 ENCOUNTER — Other Ambulatory Visit: Payer: Self-pay | Admitting: Obstetrics and Gynecology

## 2020-10-07 ENCOUNTER — Other Ambulatory Visit (HOSPITAL_COMMUNITY): Payer: Self-pay

## 2020-10-08 ENCOUNTER — Other Ambulatory Visit (HOSPITAL_COMMUNITY): Payer: Self-pay

## 2020-10-08 ENCOUNTER — Other Ambulatory Visit: Payer: Self-pay | Admitting: Obstetrics and Gynecology

## 2020-10-08 MED ORDER — XULANE 150-35 MCG/24HR TD PTWK
MEDICATED_PATCH | TRANSDERMAL | 3 refills | Status: DC
  Filled 2020-10-08: qty 3, 28d supply, fill #0
  Filled 2020-11-11: qty 9, 84d supply, fill #1

## 2020-10-10 ENCOUNTER — Other Ambulatory Visit (HOSPITAL_COMMUNITY): Payer: Self-pay

## 2020-10-12 ENCOUNTER — Other Ambulatory Visit: Payer: Self-pay

## 2020-10-12 ENCOUNTER — Ambulatory Visit: Payer: 59 | Admitting: Podiatry

## 2020-10-12 ENCOUNTER — Encounter: Payer: Self-pay | Admitting: Podiatry

## 2020-10-12 DIAGNOSIS — B351 Tinea unguium: Secondary | ICD-10-CM | POA: Diagnosis not present

## 2020-10-12 DIAGNOSIS — L603 Nail dystrophy: Secondary | ICD-10-CM

## 2020-10-12 NOTE — Progress Notes (Signed)
  Subjective:  Patient ID: Katrina Baker, female    DOB: February 12, 1996,  MRN: [id number]  Chief Complaint  Patient presents with  . Ingrown Toenail    "They are doing a lot better"    25 y.o. female returns with the above complaint. History confirmed with patient.  Doing much better Objective:  Physical Exam: warm, good capillary refill, no trophic changes or ulcerative lesions, normal DP and PT pulses and normal sensory exam.  Matricectomy sites are healing well Assessment:   1. Onychomycosis   2. Nail dystrophy      Plan:  Patient was evaluated and treated and all questions answered.     Dystrophic Nail, bilaterally -Healing well can leave open to air and discontinue soaks and ointment

## 2020-10-26 ENCOUNTER — Other Ambulatory Visit (HOSPITAL_COMMUNITY): Payer: Self-pay

## 2020-10-26 MED ORDER — XULANE 150-35 MCG/24HR TD PTWK
MEDICATED_PATCH | TRANSDERMAL | 0 refills | Status: DC
  Filled 2020-10-26: qty 18, 168d supply, fill #0
  Filled 2020-10-30: qty 9, 84d supply, fill #0

## 2020-10-30 ENCOUNTER — Other Ambulatory Visit (HOSPITAL_COMMUNITY): Payer: Self-pay

## 2020-11-02 ENCOUNTER — Other Ambulatory Visit (HOSPITAL_COMMUNITY): Payer: Self-pay

## 2020-11-04 ENCOUNTER — Other Ambulatory Visit: Payer: Self-pay | Admitting: Adult Health

## 2020-11-04 DIAGNOSIS — G43909 Migraine, unspecified, not intractable, without status migrainosus: Secondary | ICD-10-CM

## 2020-11-04 MED ORDER — RIZATRIPTAN BENZOATE 10 MG PO TABS: 10.0000 mg | ORAL_TABLET | ORAL | 1 refills | Status: DC | PRN

## 2020-11-06 ENCOUNTER — Other Ambulatory Visit (HOSPITAL_COMMUNITY): Payer: Self-pay

## 2020-11-11 ENCOUNTER — Other Ambulatory Visit (HOSPITAL_COMMUNITY): Payer: Self-pay

## 2020-12-21 ENCOUNTER — Other Ambulatory Visit: Payer: Self-pay | Admitting: Adult Health

## 2020-12-21 ENCOUNTER — Encounter: Payer: Self-pay | Admitting: Adult Health

## 2020-12-21 MED ORDER — XULANE 150-35 MCG/24HR TD PTWK
MEDICATED_PATCH | TRANSDERMAL | 0 refills | Status: DC
Start: 2020-12-21 — End: 2021-03-13

## 2020-12-21 NOTE — Progress Notes (Signed)
Patient tested + for covid 19 on 12/15/2020, has remained asymptomatic since that time in quarantine. She requested letter be written to be picked up on 12/22/2020 following 7 day quarantine clearing her for travel to [place] planned on 12/25/2020. Provider will be out of office. Letter will be written and held, nurse to verify lack of symptoms with her tomorrow prior to providing signed letter.

## 2020-12-30 ENCOUNTER — Encounter: Payer: 59 | Admitting: Physician Assistant

## 2021-03-13 ENCOUNTER — Other Ambulatory Visit: Payer: Self-pay | Admitting: Adult Health

## 2021-04-22 ENCOUNTER — Encounter: Payer: Self-pay | Admitting: Adult Health

## 2021-04-22 DIAGNOSIS — E559 Vitamin D deficiency, unspecified: Secondary | ICD-10-CM | POA: Insufficient documentation

## 2021-04-22 DIAGNOSIS — E785 Hyperlipidemia, unspecified: Secondary | ICD-10-CM

## 2021-04-22 HISTORY — DX: Hyperlipidemia, unspecified: E78.5

## 2021-04-22 NOTE — Progress Notes (Deleted)
Patient ID: Katrina Baker, female   DOB: 04-08-1996, 25 y.o.   MRN: [id number] COMPLETE PHYSICAL   Assessment and Plan  Encounter for Annual Physical Exam with abnormal findings Due annually  Health Maintenance reviewed Healthy lifestyle reviewed and goals set  Check with peds for immunization record ***  Overweight - BMI *** Long discussion about weight loss, diet, and exercise Recommended diet heavy in fruits and veggies and low in animal meats, cheeses, and dairy products, appropriate calorie intake Discussed appropriate weight for height  Follow up at next visit  B12 deficiency      -      Vitamin B12  PCOS (polycystic ovarian syndrome) GYN follows, improved on HBC patch; monitor   Medication management -     CBC with Differential/Platelet -     COMPLETE METABOLIC PANEL WITH GFR  Hyperlipidemia Continue low cholesterol diet and exercise.  Check lipid panel.  -     Lipid panel -     TSH  Vitamin D deficiency -     VITAMIN D 25 Hydroxy (Vit-D Deficiency, Fractures)  Continue prudent diet as discussed, weight control, regular exercise, and medications. Routine screening labs and tests as requested with regular follow-up as recommended.  Over 40 minutes of exam, counseling, chart review and critical decision making was performed  Future Appointments  Date Time Provider [place]  04/23/2021  9:30 AM Liane Comber, NP [facility] None      Annual Screening Comprehensive Examination   This very nice 25 y.o.female presents for complete physical. She has PCOS (polycystic ovarian syndrome); Overweight (BMI 25.0-29.9); Onychomycosis; B12 deficiency; and Migraine without status migrainosus, not intractable on their problem list.   Patient reports no complaints at this time.   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   Single and  loving it -   She does have a history of PCOS, diagnosed when she was 74. She [facility] Off of metformin, made her feel bad and didn't help, now on xulane weekly patch.   Saw podiatry for possible onychomycosis, pending test results for treatment -   BMI is There is no height or weight on file to calculate BMI., she is working on diet, cutting out processed, dairy intake. Trying to increase fruits and veggies, not sure, often forgets to eat She is up and moving a lot at her work, does strength training a few days a week.  Water: 65+ most days, minimal caffeine, does decaf Alcohol socially, rare Sleep - stays up too late decompressing Wt Readings from Last 3 Encounters:  07/15/20 173 lb (78.5 kg)  04/22/20 172 lb 12.8 oz (78.4 kg)  12/24/18 171 lb 9.6 oz (77.8 kg)   She does have a history of migraines, states has improved with ear piercing. .    Today their BP is    She does workout. She denies chest pain, shortness of breath, dizziness. Hx of vasovagal syncope, none in last 3 years.    She is not on cholesterol medication and denies myalgias. Her cholesterol is not at goal. The cholesterol last visit was:   Lab Results  Component Value Date   CHOL 192 04/22/2020   HDL 70 04/22/2020   LDLCALC 103 (H) 04/22/2020   TRIG 95 04/22/2020   CHOLHDL 2.7 04/22/2020    She has been working  on diet and exercise for glucose management, and denies increased appetite, nausea, paresthesia of the feet, polydipsia, polyuria and visual disturbances. Last A1C in the office was:  Lab Results  Component Value Date   HGBA1C 5.0 03/28/2016   Patient is on Vitamin D supplement, takes 1000 IU but forgets  Lab Results  Component Value Date   VD25OH 22 (L) 04/22/2020     She takes 1000 mcg tablet, admits forgets to take Lab Results  Component Value Date   VITAMINB12 300 04/22/2020       Current Outpatient Medications on File Prior to Visit  Medication  Sig Dispense Refill   albuterol (VENTOLIN HFA) 108 (90 Base) MCG/ACT inhaler INHALE 2 PUFFS INTO THE LUNGS EVERY 4 HOURS AS NEEDED FOR WHEEZING OR SHORTNESS OF BREATH 18 g 0   cholecalciferol (VITAMIN D3) 25 MCG (1000 UNIT) tablet Take 1,000 Units by mouth daily.     cyanocobalamin 1000 MCG tablet Vitamin B-12  1,000 mcg tablet  Take by oral route.     naproxen (NAPROSYN) 500 MG tablet Take 500 mg by mouth daily as needed.     neomycin-polymyxin-hydrocortisone (CORTISPORIN) 3.5-10000-1 OTIC suspension Apply 1-2 drops daily after soaking and cover with bandaid 10 mL 0   rizatriptan (MAXALT) 10 MG tablet Take 1 tablet (10 mg total) by mouth as needed for migraine. May repeat in 2 hours if needed 10 tablet 1   XULANE 150-35 MCG/24HR transdermal patch APPLY 1 PATCH TO SKIN ONCE WEEKLY AS DIRECTED 13 patch 3   No current facility-administered medications on file prior to visit.   Immunization History  Administered Date(s) Administered   DTaP 05/02/1997, 07/04/1997, 09/04/1997, 07/21/1998, 02/28/2001   Hepatitis A 06/09/2005, 05/30/2006   Hepatitis B 18-Mar-1997, 04/01/1997, 09/04/1997   HiB (PRP-OMP) 05/02/1997, 07/04/1997, 07/21/1998   Hpv-Unspecified 08/11/2009, 10/16/2009, 02/26/2010   Influenza Inj Mdck Quad Pf 04/12/2019   Influenza-Unspecified 03/10/2014, 03/14/2017, 03/25/2020   MMR 03/13/1998, 02/28/2001   MenQuadfi_Meningococcal Groups ACYW Conjugate 07/25/2008, 08/15/2014   PFIZER(Purple Top)SARS-COV-2 Vaccination 06/27/2019, 07/16/2019, 03/13/2020   Td 07/19/2007   Tdap 07/19/2007   Varicella 03/13/1998, 05/30/2006    Will get immunization records.   Tetanus: unsure when but was UTD for college  Flu: 03/2020 HPV: had as a teen  Covid 58: has had 3/3, 2021, pfizer - will send infor  PAP - has annually by GYN, last 2021  Last eye: 2021, no issues  Last dental: 2021, goes q63m  No LMP recorded. Never sexually active   Allergies No Known Allergies  SURGICAL HISTORY She   has a past surgical history that includes No past surgeries.   FAMILY HISTORY Her family history includes COPD in her maternal grandmother; Cancer in her maternal aunt; Diabetes in her father; Factor V Leiden deficiency in her mother and another family member; Heart Problems in her mother; High Cholesterol in her father; High blood pressure in her father; Hypertension in her maternal grandfather; Lung cancer in her paternal grandmother.   SOCIAL HISTORY She  reports that she has never smoked. She has never used smokeless tobacco. She reports that she does not currently use alcohol. She reports that she does not use drugs.    Review of Systems  Constitutional:  Negative for chills, fever, malaise/fatigue and weight loss.  HENT:  Negative for congestion, ear pain, hearing loss, sore throat and tinnitus.   Eyes:  Negative for blurred vision and double vision.  Respiratory:  Negative for cough, sputum production, shortness of breath and wheezing.  Cardiovascular:  Negative for chest pain, palpitations, orthopnea, claudication, leg swelling and PND.  Gastrointestinal:  Negative for abdominal pain, blood in stool, constipation, diarrhea, heartburn, melena, nausea and vomiting.  Genitourinary: Negative.  Negative for dysuria, frequency, hematuria and urgency.  Musculoskeletal:  Negative for falls, joint pain and myalgias.  Skin: Negative.  Negative for rash.  Neurological:  Positive for headaches (rare, L sided migraines). Negative for dizziness, tingling, sensory change, loss of consciousness and weakness.  Endo/Heme/Allergies:  Negative for polydipsia.  Psychiatric/Behavioral: Negative.  Negative for depression, memory loss, substance abuse and suicidal ideas. The patient is not nervous/anxious and does not have insomnia.   All other systems reviewed and are negative.   Physical Exam  There were no vitals taken for this visit.  General Appearance: Well nourished and in no apparent  distress. Eyes: PERRLA, EOMs, conjunctiva no swelling or erythema, normal fundi and vessels. Sinuses: No frontal/maxillary tenderness ENT/Mouth: EACs patent / TMs  nl. Nares clear without erythema, swelling, mucoid exudates. Oral hygiene is good. No erythema, swelling, or exudate. Tongue normal, non-obstructing. Tonsils not swollen or erythematous. Hearing normal.  Neck: Supple, thyroid normal. No bruits, nodes or JVD. Respiratory: Respiratory effort normal.  BS equal and clear bilateral without rales, rhonci, wheezing or stridor. Cardio: Heart sounds are normal with regular rate and rhythm and no murmurs, rubs or gallops. Peripheral pulses are normal and equal bilaterally without edema. No aortic or femoral bruits. Chest: symmetric with normal excursions and percussion..  Abdomen: Flat, soft, with bowl sounds. Nontender, no guarding, rebound, hernias, masses, or organomegaly.  Lymphatics: Non tender without lymphadenopathy.  Musculoskeletal: Full ROM all peripheral extremities, joint stability, 5/5 strength, and normal gait. Skin: Warm and dry without rashes, lesions, cyanosis, clubbing or  ecchymosis. Bil great toenails thickened and yellow  Neuro: Cranial nerves intact, reflexes equal bilaterally. Normal muscle tone, no cerebellar symptoms. Sensation intact.  Pysch: Awake and oriented X 3, normal affect, Insight and Judgment appropriate.  Breast/GU: deferred to [place], NP 2:07 PM [facility]

## 2021-04-23 ENCOUNTER — Encounter: Payer: Self-pay | Admitting: Adult Health

## 2021-04-23 DIAGNOSIS — G43909 Migraine, unspecified, not intractable, without status migrainosus: Secondary | ICD-10-CM

## 2021-04-23 DIAGNOSIS — Z1389 Encounter for screening for other disorder: Secondary | ICD-10-CM

## 2021-04-23 DIAGNOSIS — E538 Deficiency of other specified B group vitamins: Secondary | ICD-10-CM

## 2021-04-23 DIAGNOSIS — Z13228 Encounter for screening for other metabolic disorders: Secondary | ICD-10-CM

## 2021-04-23 DIAGNOSIS — E282 Polycystic ovarian syndrome: Secondary | ICD-10-CM

## 2021-04-23 DIAGNOSIS — E663 Overweight: Secondary | ICD-10-CM

## 2021-04-23 DIAGNOSIS — E785 Hyperlipidemia, unspecified: Secondary | ICD-10-CM

## 2021-04-23 DIAGNOSIS — B351 Tinea unguium: Secondary | ICD-10-CM

## 2021-04-23 DIAGNOSIS — Z131 Encounter for screening for diabetes mellitus: Secondary | ICD-10-CM

## 2021-04-23 DIAGNOSIS — Z0000 Encounter for general adult medical examination without abnormal findings: Secondary | ICD-10-CM

## 2021-04-23 DIAGNOSIS — Z130 Encounter for screening for diseases of the blood and blood-forming organs and certain disorders involving the immune mechanism: Secondary | ICD-10-CM

## 2021-04-23 DIAGNOSIS — E559 Vitamin D deficiency, unspecified: Secondary | ICD-10-CM

## 2021-05-24 ENCOUNTER — Encounter: Payer: Self-pay | Admitting: Adult Health

## 2021-05-25 ENCOUNTER — Other Ambulatory Visit: Payer: Self-pay | Admitting: Adult Health

## 2021-05-25 MED ORDER — XULANE 150-35 MCG/24HR TD PTWK: 1.0000 | MEDICATED_PATCH | TRANSDERMAL | 3 refills | Status: DC

## 2021-11-26 IMAGING — CR DG CHEST 2V
2 series · 2 of 2 positions shown · non-contrast
Comparison: None.

CLINICAL DATA: Cough for 1 month with increasing productivity, rule
out pneumonia, negative NBE7M-3M test

EXAM:
CHEST - 2 VIEW

[w chest pa]
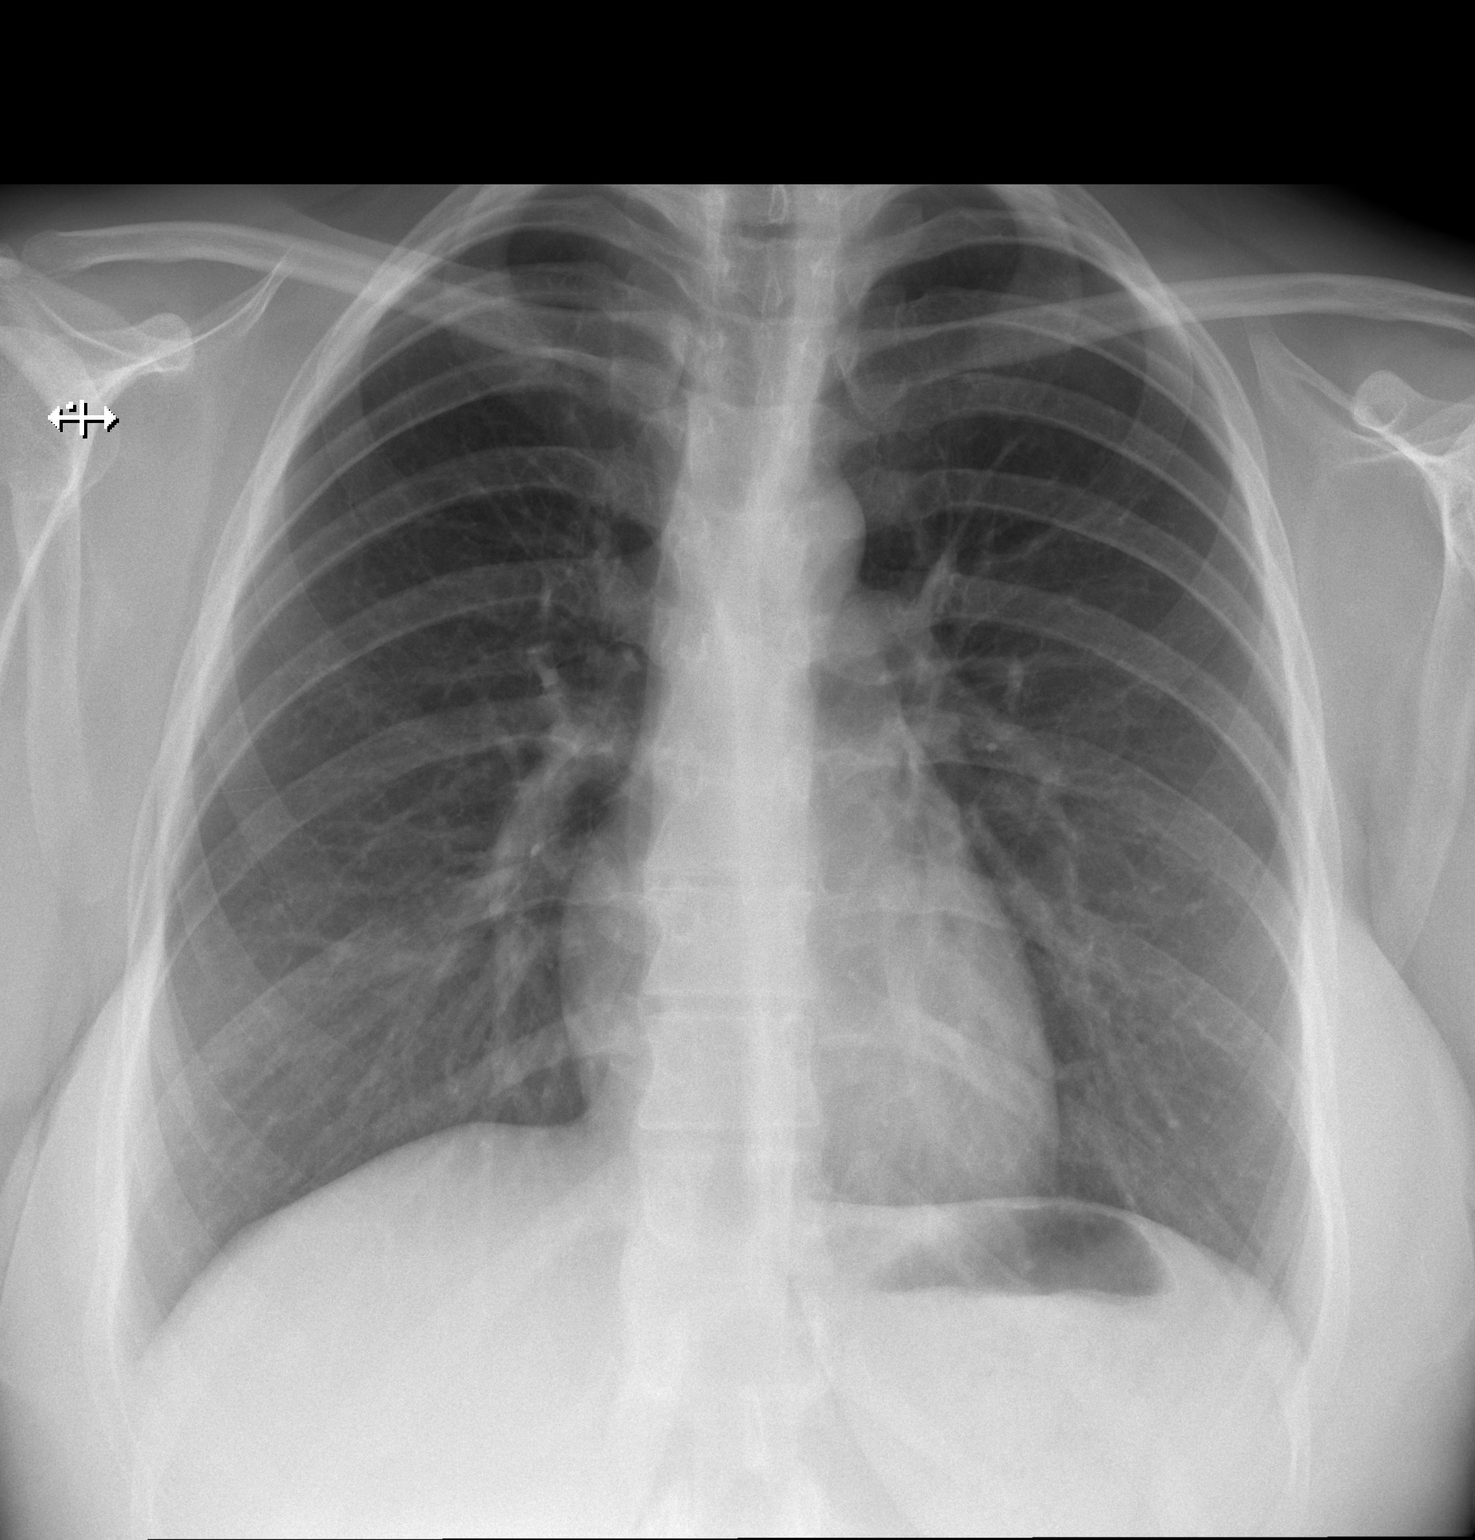

[w chest lat]
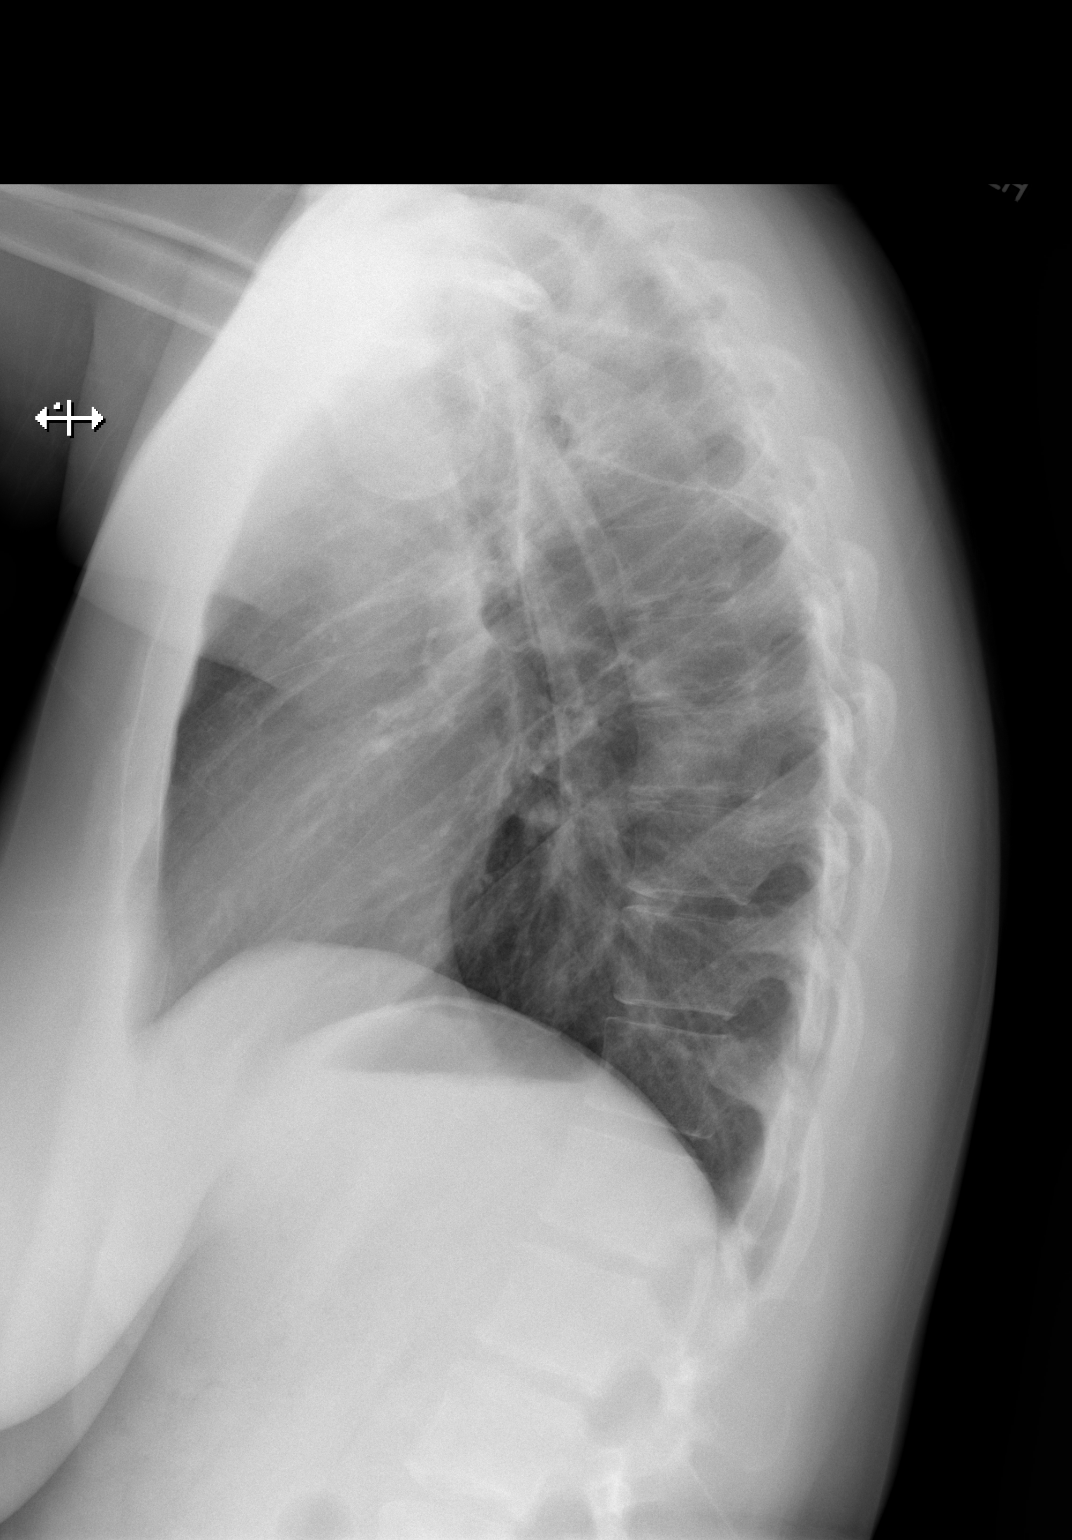

[2 of 2 positions shown; findings below may reference images not displayed]

FINDINGS: No consolidation, features of edema, pneumothorax, or effusion.
Pulmonary vascularity is normally distributed. The cardiomediastinal
contours are unremarkable. No acute osseous or soft tissue
abnormality.
IMPRESSION: No acute cardiopulmonary abnormality.

## 2022-01-08 NOTE — Progress Notes (Signed)
Patient ID: MAEVRY PEDDLE, female   DOB: 11-18-1995, 26 y.o.   MRN: [id number] COMPLETE PHYSICAL   Assessment and Plan   Ariahna was seen today for annual exam.  Diagnoses and all orders for this visit:  Encounter for general adult medical examination with abnormal findings Due yearly All labs deferred as she had them in [place] and she is self pay  Overweight (BMI 25.0-29.9) Continue  limiting saturated fats and simple carbs Increase fresh fruits and vegetables Increase activity Has lost 12 pounds in the past year  B12 deficiency Continue supplementation  Hyperlipidemia, unspecified hyperlipidemia type Last check in [place] several months ago was normal Continue diet and exercise  Vitamin D deficiency Continue Vit D supplementation to maintain value in therapeutic level of 60-100   Medication management Continued  PCOS (polycystic ovarian syndrome) Continue to use Xulane for control of menstrual periods -     XULANE 150-35 MCG/24HR transdermal patch; Place 1 patch onto the skin once a week.  Migraine without status migrainosus, not intractable, unspecified migraine type Better controlled with diet and exercise, uses Maxalt rarely -     rizatriptan (MAXALT) 10 MG tablet; Take 1 tablet (10 mg total) by mouth as needed for migraine. May repeat in 2 hours if needed  Acne On Accutane from clinic in [place] Is not sexually active but is on transdermal birth control     Annual Screening Comprehensive Examination   This very nice 26 y.o.female presents for complete physical. She has PCOS (polycystic ovarian syndrome); Overweight (BMI 25.0-29.9); Onychomycosis; B12 deficiency; Migraine without status migrainosus, not intractable; Hyperlipidemia; and Vitamin D deficiency on their problem list.   Patient reports no complaints at this time.   She graduated from psych at [facility] since Feb 2022 and is going back in a few weeks. She had lab work done in [place] and had CBC, CMP,  Cholesterol all done and were normal Her Vit D was a little low and is taking Vit D daily 1000 units  Accutane use for acne.  She is using transdermal birth control. She is not sexually active.   She does have a history of PCOS, diagnosed when she was 13. She follows with [facility]. Off of metformin, made her feel bad and didn't help, now on xulane weekly patch. She needs refill of her Xulane patch before heading back to [place]   BMI is Body mass index is 27.28 kg/m., she is eating much cleaner and healthier in [place] Wt Readings from Last 3 Encounters:  01/10/22 161 lb 6.4 oz (73.2 kg)  07/15/20 173 lb (78.5 kg)  04/22/20 172 lb 12.8 oz (78.4 kg)   She does have a history of migraines, states has improved with ear piercing. She has had some decrease in frequency , still will occasionally need to use Maxalt    Today their BP is BP: 124/60  BP Readings from Last 3 Encounters:  01/10/22 124/60  07/15/20 136/74  04/22/20 134/78  She does workout. She denies chest pain, shortness of breath, dizziness. Hx of vasovagal syncope, none in last 3 years.     She is not on cholesterol medication and denies myalgias. Her cholesterol is not at goal. The cholesterol last visit was:   Lab Results  Component Value Date   CHOL 192 04/22/2020   HDL 70 04/22/2020   LDLCALC 103 (H) 04/22/2020   TRIG 95 04/22/2020   CHOLHDL 2.7 04/22/2020    She has been working on  diet and exercise for glucose management, and denies increased appetite, nausea, paresthesia of the feet, polydipsia, polyuria and visual disturbances. Last A1C in the office was:  Lab Results  Component Value Date   HGBA1C 5.0 03/28/2016   Patient is on Vitamin D supplement, takes 1000 IU but forgets  Lab Results  Component Value Date   VD25OH 22 (L) 04/22/2020     She takes 1000 mcg tablet, admits forgets to take Lab Results  Component Value Date   VITAMINB12 300 04/22/2020       Current Outpatient  Medications on File Prior to Visit  Medication Sig Dispense Refill   cholecalciferol (VITAMIN D3) 25 MCG (1000 UNIT) tablet Take 1,000 Units by mouth daily.     cyanocobalamin 1000 MCG tablet Vitamin B-12  1,000 mcg tablet  Take by oral route.     isotretinoin (ACCUTANE) 10 MG capsule Take 10 mg by mouth 2 (two) times daily.     naproxen (NAPROSYN) 500 MG tablet Take 500 mg by mouth daily as needed.     rizatriptan (MAXALT) 10 MG tablet Take 1 tablet (10 mg total) by mouth as needed for migraine. May repeat in 2 hours if needed 10 tablet 1   XULANE 150-35 MCG/24HR transdermal patch Place 1 patch onto the skin once a week. 13 patch 3   albuterol (VENTOLIN HFA) 108 (90 Base) MCG/ACT inhaler INHALE 2 PUFFS INTO THE LUNGS EVERY 4 HOURS AS NEEDED FOR WHEEZING OR SHORTNESS OF BREATH 18 g 0   neomycin-polymyxin-hydrocortisone (CORTISPORIN) 3.5-10000-1 OTIC suspension Apply 1-2 drops daily after soaking and cover with bandaid (Patient not taking: Reported on 01/10/2022) 10 mL 0   No current facility-administered medications on file prior to visit.   Immunization History  Administered Date(s) Administered   DTaP 05/02/1997, 07/04/1997, 09/04/1997, 07/21/1998, 02/28/2001   Hepatitis A 06/09/2005, 05/30/2006   Hepatitis B 1996-05-28, 04/01/1997, 09/04/1997   HiB (PRP-OMP) 05/02/1997, 07/04/1997, 07/21/1998   Hpv-Unspecified 08/11/2009, 10/16/2009, 02/26/2010   Influenza Inj Mdck Quad Pf 04/12/2019   Influenza-Unspecified 03/10/2014, 03/14/2017, 03/25/2020   MMR 03/13/1998, 02/28/2001   MenQuadfi_Meningococcal Groups ACYW Conjugate 07/25/2008, 08/15/2014   PFIZER(Purple Top)SARS-COV-2 Vaccination 06/27/2019, 07/16/2019, 03/13/2020   Td 07/19/2007   Tdap 07/19/2007   Varicella 03/13/1998, 05/30/2006    Will get immunization records.   Tetanus: unsure when but was UTD for college  Flu: 03/2020 HPV: had as a teen  Covid 72: has had 3/3, 2021, pfizer - will send infor  PAP - has annually by GYN,  last 2021  Last eye: 2021, no issues  Last dental: 2021, goes q46m  Patient's last menstrual period was 12/16/2021. Never sexually active   Allergies No Known Allergies  SURGICAL HISTORY She  has a past surgical history that includes No past surgeries.   FAMILY HISTORY Her family history includes COPD in her maternal grandmother; Cancer in her maternal aunt; Diabetes in her father; Factor V Leiden deficiency in her mother and another family member; Heart Problems in her mother; High Cholesterol in her father; High blood pressure in her father; Hypertension in her maternal grandfather; Lung cancer in her paternal grandmother.   SOCIAL HISTORY She  reports that she has never smoked. She has never used smokeless tobacco. She reports that she does not currently use alcohol. She reports that she does not use drugs.    Review of Systems  Constitutional:  Negative for chills, fever and malaise/fatigue.  HENT:  Negative for congestion, ear pain and sore throat.   Respiratory:  Negative for cough, shortness of breath and wheezing.   Cardiovascular:  Negative for chest pain, palpitations and leg swelling.  Gastrointestinal:  Negative for abdominal pain, blood in stool, constipation, diarrhea, heartburn and melena.  Genitourinary:  Negative for dysuria, frequency, hematuria and urgency.  Skin: Negative.   Neurological:  Positive for headaches (rare, L sided migraines). Negative for dizziness, sensory change and loss of consciousness.  Psychiatric/Behavioral:  Negative for depression. The patient is not nervous/anxious and does not have insomnia.      Physical Exam  BP 124/60   Pulse 92   Temp 97.7 F (36.5 C)   Ht 5' 4.5" (1.638 m)   Wt 161 lb 6.4 oz (73.2 kg)   LMP 12/16/2021   SpO2 97%   BMI 27.28 kg/m   General Appearance: Well nourished and in no apparent distress. Eyes: PERRLA, EOMs, conjunctiva no swelling or erythema, normal fundi and vessels. Sinuses: No frontal/maxillary  tenderness ENT/Mouth: EACs patent / TMs  nl. Nares clear without erythema, swelling, mucoid exudates. Oral hygiene is good. No erythema, swelling, or exudate. Tongue normal, non-obstructing. Tonsils not swollen or erythematous. Hearing normal.  Neck: Supple, thyroid normal. No bruits, nodes or JVD. Respiratory: Respiratory effort normal.  BS equal and clear bilateral without rales, rhonci, wheezing or stridor. Cardio: Heart sounds are normal with regular rate and rhythm and no murmurs, rubs or gallops. Peripheral pulses are normal and equal bilaterally without edema. No aortic or femoral bruits. Chest: symmetric with normal excursions and percussion..  Abdomen: Flat, soft, with bowl sounds. Nontender, no guarding, rebound, hernias, masses, or organomegaly.  Lymphatics: Non tender without lymphadenopathy.  Musculoskeletal: Full ROM all peripheral extremities, joint stability, 5/5 strength, and normal gait. Skin: Warm and dry without rashes, lesions, cyanosis, clubbing or  ecchymosis. Bil great toenails thickened and yellow  Neuro: Cranial nerves intact, reflexes equal bilaterally. Normal muscle tone, no cerebellar symptoms. Sensation intact.  Pysch: Awake and oriented X 3, normal affect, Insight and Judgment appropriate.  Breast/GU: deferred to GYN  Raynelle Dick, NP 3:20 PM [facility]

## 2022-01-10 ENCOUNTER — Other Ambulatory Visit (HOSPITAL_COMMUNITY): Payer: Self-pay

## 2022-01-10 ENCOUNTER — Encounter: Payer: Self-pay | Admitting: Nurse Practitioner

## 2022-01-10 ENCOUNTER — Ambulatory Visit (INDEPENDENT_AMBULATORY_CARE_PROVIDER_SITE_OTHER): Payer: Self-pay | Admitting: Nurse Practitioner

## 2022-01-10 VITALS — BP 124/60 | HR 92 | Temp 97.7°F | Ht 64.5 in | Wt 161.4 lb

## 2022-01-10 DIAGNOSIS — E785 Hyperlipidemia, unspecified: Secondary | ICD-10-CM

## 2022-01-10 DIAGNOSIS — E663 Overweight: Secondary | ICD-10-CM

## 2022-01-10 DIAGNOSIS — E538 Deficiency of other specified B group vitamins: Secondary | ICD-10-CM

## 2022-01-10 DIAGNOSIS — Z79899 Other long term (current) drug therapy: Secondary | ICD-10-CM

## 2022-01-10 DIAGNOSIS — L709 Acne, unspecified: Secondary | ICD-10-CM

## 2022-01-10 DIAGNOSIS — G43909 Migraine, unspecified, not intractable, without status migrainosus: Secondary | ICD-10-CM

## 2022-01-10 DIAGNOSIS — Z1389 Encounter for screening for other disorder: Secondary | ICD-10-CM

## 2022-01-10 DIAGNOSIS — Z1329 Encounter for screening for other suspected endocrine disorder: Secondary | ICD-10-CM

## 2022-01-10 DIAGNOSIS — Z0001 Encounter for general adult medical examination with abnormal findings: Secondary | ICD-10-CM

## 2022-01-10 DIAGNOSIS — E559 Vitamin D deficiency, unspecified: Secondary | ICD-10-CM

## 2022-01-10 DIAGNOSIS — E282 Polycystic ovarian syndrome: Secondary | ICD-10-CM

## 2022-01-10 MED ORDER — XULANE 150-35 MCG/24HR TD PTWK
1.0000 | MEDICATED_PATCH | TRANSDERMAL | 3 refills | Status: DC
  Filled 2022-01-10: qty 12, 84d supply, fill #0

## 2022-01-10 MED ORDER — RIZATRIPTAN BENZOATE 10 MG PO TABS
10.0000 mg | ORAL_TABLET | ORAL | 1 refills | Status: DC | PRN
  Filled 2022-01-10: qty 10, 5d supply, fill #0

## 2022-01-11 ENCOUNTER — Other Ambulatory Visit (HOSPITAL_COMMUNITY): Payer: Self-pay

## 2022-01-21 ENCOUNTER — Telehealth: Payer: Self-pay | Admitting: Nurse Practitioner

## 2022-01-21 ENCOUNTER — Other Ambulatory Visit (HOSPITAL_COMMUNITY): Payer: Self-pay

## 2022-01-21 DIAGNOSIS — E282 Polycystic ovarian syndrome: Secondary | ICD-10-CM

## 2022-01-21 DIAGNOSIS — G43909 Migraine, unspecified, not intractable, without status migrainosus: Secondary | ICD-10-CM

## 2022-01-21 MED ORDER — XULANE 150-35 MCG/24HR TD PTWK: 1.0000 | MEDICATED_PATCH | TRANSDERMAL | 3 refills | Status: DC

## 2022-01-21 MED ORDER — RIZATRIPTAN BENZOATE 10 MG PO TABS: 10.0000 mg | ORAL_TABLET | ORAL | 1 refills | Status: DC | PRN

## 2022-01-21 NOTE — Addendum Note (Signed)
Addended by: Chancy Hurter on: 01/21/2022 10:19 AM   Modules accepted: Orders

## 2022-01-21 NOTE — Telephone Encounter (Signed)
Patient is requesting to have prescriptions for Maxalt and Xutane sent to a different pharmacy. Can you send it to [facility] [address] in [place] please?

## 2022-04-26 ENCOUNTER — Encounter: Payer: Self-pay | Admitting: Adult Health

## 2023-01-10 NOTE — Progress Notes (Deleted)
Patient ID: Katrina Baker, female   DOB: Aug 18, 1995, 27 y.o.   MRN: [id number] COMPLETE PHYSICAL   Assessment and Plan   [name] was seen today for annual exam.  Diagnoses and all orders for this visit:  Encounter for general adult medical examination with abnormal findings Due yearly All labs deferred as she had them in [place] and she is self pay  Overweight (BMI 25.0-29.9) Continue  limiting saturated fats and simple carbs Increase fresh fruits and vegetables Increase activity Has lost 12 pounds in the past year  B12 deficiency Continue supplementation  Hyperlipidemia, unspecified hyperlipidemia type Last check in [place] several months ago was normal Continue diet and exercise  Vitamin D deficiency Continue Vit D supplementation to maintain value in therapeutic level of 60-100   Medication management Continued  PCOS (polycystic ovarian syndrome) Continue to use Xulane for control of menstrual periods -     XULANE 150-35 MCG/24HR transdermal patch; Place 1 patch onto the skin once a week.  Migraine without status migrainosus, not intractable, unspecified migraine type Better controlled with diet and exercise, uses Maxalt rarely -     rizatriptan (MAXALT) 10 MG tablet; Take 1 tablet (10 mg total) by mouth as needed for migraine. May repeat in 2 hours if needed  Acne On Accutane from clinic in [place] Is not sexually active but is on transdermal birth control     Annual Screening Comprehensive Examination   This very nice 27 y.o.female presents for complete physical. She has PCOS (polycystic ovarian syndrome); Overweight (BMI 25.0-29.9); Onychomycosis; B12 deficiency; Migraine without status migrainosus, not intractable; Hyperlipidemia; and Vitamin D deficiency on their problem list.   Patient reports no complaints at this time.   She graduated from psych at [facility] since Feb 2022 and is going back in a few weeks. She had lab work done in [place] and had CBC, CMP,  Cholesterol all done and were normal Her Vit D was a little low and is taking Vit D daily 1000 units  Accutane use for acne.  She is using transdermal birth control. She is not sexually active.   She does have a history of PCOS, diagnosed when she was 13. She follows with GYN Dr[facility]. Off of metformin, made her feel bad and didn't help, now on xulane weekly patch. She needs refill of her Xulane patch before heading back to [place]   BMI is There is no height or weight on file to calculate BMI., she is eating much cleaner and healthier in [place] Wt Readings from Last 3 Encounters:  01/10/22 161 lb 6.4 oz (73.2 kg)  07/15/20 173 lb (78.5 kg)  04/22/20 172 lb 12.8 oz (78.4 kg)   She does have a history of migraines, states has improved with ear piercing. She has had some decrease in frequency , still will occasionally need to use Maxalt    Today their BP is    BP Readings from Last 3 Encounters:  01/10/22 124/60  07/15/20 136/74  04/22/20 134/78  She does workout. She denies chest pain, shortness of breath, dizziness. Hx of vasovagal syncope, none in last 3 years.     She is not on cholesterol medication and denies myalgias. Her cholesterol is not at goal. The cholesterol last visit was:   Lab Results  Component Value Date   CHOL 192 04/22/2020   HDL 70 04/22/2020   LDLCALC 103 (H) 04/22/2020   TRIG 95 04/22/2020   CHOLHDL 2.7 04/22/2020  She has been working on diet and exercise for glucose management, and denies increased appetite, nausea, paresthesia of the feet, polydipsia, polyuria and visual disturbances. Last A1C in the office was:  Lab Results  Component Value Date   HGBA1C 5.0 03/28/2016   Patient is on Vitamin D supplement, takes 1000 IU but forgets  Lab Results  Component Value Date   VD25OH 22 (L) 04/22/2020     She takes 1000 mcg tablet, admits forgets to take Lab Results  Component Value Date   VITAMINB12 300 04/22/2020        Current Outpatient Medications on File Prior to Visit  Medication Sig Dispense Refill   albuterol (VENTOLIN HFA) 108 (90 Base) MCG/ACT inhaler INHALE 2 PUFFS INTO THE LUNGS EVERY 4 HOURS AS NEEDED FOR WHEEZING OR SHORTNESS OF BREATH 18 g 0   cholecalciferol (VITAMIN D3) 25 MCG (1000 UNIT) tablet Take 1,000 Units by mouth daily.     cyanocobalamin 1000 MCG tablet Vitamin B-12  1,000 mcg tablet  Take by oral route.     isotretinoin (ACCUTANE) 10 MG capsule Take 10 mg by mouth 2 (two) times daily.     naproxen (NAPROSYN) 500 MG tablet Take 500 mg by mouth daily as needed.     neomycin-polymyxin-hydrocortisone (CORTISPORIN) 3.5-10000-1 OTIC suspension Apply 1-2 drops daily after soaking and cover with bandaid (Patient not taking: Reported on 01/10/2022) 10 mL 0   rizatriptan (MAXALT) 10 MG tablet Take 1 tablet (10 mg total) by mouth as needed for migraine. May repeat in 2 hours if needed 10 tablet 1   XULANE 150-35 MCG/24HR transdermal patch Place 1 patch onto the skin once a week. 13 patch 3   No current facility-administered medications on file prior to visit.   Immunization History  Administered Date(s) Administered   DTaP 05/02/1997, 07/04/1997, 09/04/1997, 07/21/1998, 02/28/2001   HIB (PRP-OMP) 05/02/1997, 07/04/1997, 07/21/1998   Hepatitis A 06/09/2005, 05/30/2006   Hepatitis B 1997-03-17, 04/01/1997, 09/04/1997   Hpv-Unspecified 08/11/2009, 10/16/2009, 02/26/2010   Influenza Inj Mdck Quad Pf 04/12/2019   Influenza-Unspecified 03/10/2014, 03/14/2017, 03/25/2020   MMR 03/13/1998, 02/28/2001   MenQuadfi_Meningococcal Groups ACYW Conjugate 07/25/2008, 08/15/2014   PFIZER(Purple Top)SARS-COV-2 Vaccination 06/27/2019, 07/16/2019, 03/13/2020   Td 07/19/2007   Tdap 07/19/2007   Varicella 03/13/1998, 05/30/2006    Will get immunization records.   Tetanus: unsure when but was UTD for college  Flu: 03/2020 HPV: had as a teen  Covid 90: has had 3/3, 2021, pfizer - will send infor  PAP -  has annually by GYN, last 2021  Last eye: 2021, no issues  Last dental: 2021, goes q38m  No LMP recorded. Never sexually active   Allergies No Known Allergies  SURGICAL HISTORY She  has a past surgical history that includes No past surgeries.   FAMILY HISTORY Her family history includes COPD in her maternal grandmother; Cancer in her maternal aunt; Diabetes in her father; Factor V Leiden deficiency in her mother and another family member; Heart Problems in her mother; High Cholesterol in her father; High blood pressure in her father; Hypertension in her maternal grandfather; Lung cancer in her paternal grandmother.   SOCIAL HISTORY She  reports that she has never smoked. She has never used smokeless tobacco. She reports that she does not currently use alcohol. She reports that she does not use drugs.    Review of Systems  Constitutional:  Negative for chills, fever and malaise/fatigue.  HENT:  Negative for congestion, ear pain and sore throat.  Respiratory:  Negative for cough, shortness of breath and wheezing.   Cardiovascular:  Negative for chest pain, palpitations and leg swelling.  Gastrointestinal:  Negative for abdominal pain, blood in stool, constipation, diarrhea, heartburn and melena.  Genitourinary:  Negative for dysuria, frequency, hematuria and urgency.  Skin: Negative.   Neurological:  Positive for headaches (rare, L sided migraines). Negative for dizziness, sensory change and loss of consciousness.  Psychiatric/Behavioral:  Negative for depression. The patient is not nervous/anxious and does not have insomnia.      Physical Exam  There were no vitals taken for this visit.  General Appearance: Well nourished and in no apparent distress. Eyes: PERRLA, EOMs, conjunctiva no swelling or erythema, normal fundi and vessels. Sinuses: No frontal/maxillary tenderness ENT/Mouth: EACs patent / TMs  nl. Nares clear without erythema, swelling, mucoid exudates. Oral hygiene is  good. No erythema, swelling, or exudate. Tongue normal, non-obstructing. Tonsils not swollen or erythematous. Hearing normal.  Neck: Supple, thyroid normal. No bruits, nodes or JVD. Respiratory: Respiratory effort normal.  BS equal and clear bilateral without rales, rhonci, wheezing or stridor. Cardio: Heart sounds are normal with regular rate and rhythm and no murmurs, rubs or gallops. Peripheral pulses are normal and equal bilaterally without edema. No aortic or femoral bruits. Chest: symmetric with normal excursions and percussion..  Abdomen: Flat, soft, with bowl sounds. Nontender, no guarding, rebound, hernias, masses, or organomegaly.  Lymphatics: Non tender without lymphadenopathy.  Musculoskeletal: Full ROM all peripheral extremities, joint stability, 5/5 strength, and normal gait. Skin: Warm and dry without rashes, lesions, cyanosis, clubbing or  ecchymosis. Bil great toenails thickened and yellow  Neuro: Cranial nerves intact, reflexes equal bilaterally. Normal muscle tone, no cerebellar symptoms. Sensation intact.  Pysch: Awake and oriented X 3, normal affect, Insight and Judgment appropriate.  Breast/GU: deferred to GYN  Raynelle Dick, NP 1:01 PM [facility]

## 2023-01-11 ENCOUNTER — Encounter: Payer: Self-pay | Admitting: Nurse Practitioner

## 2023-01-11 DIAGNOSIS — Z0001 Encounter for general adult medical examination with abnormal findings: Secondary | ICD-10-CM

## 2023-01-11 DIAGNOSIS — Z131 Encounter for screening for diabetes mellitus: Secondary | ICD-10-CM

## 2023-01-11 DIAGNOSIS — E559 Vitamin D deficiency, unspecified: Secondary | ICD-10-CM

## 2023-01-11 DIAGNOSIS — G43909 Migraine, unspecified, not intractable, without status migrainosus: Secondary | ICD-10-CM

## 2023-01-11 DIAGNOSIS — E785 Hyperlipidemia, unspecified: Secondary | ICD-10-CM

## 2023-01-11 DIAGNOSIS — Z79899 Other long term (current) drug therapy: Secondary | ICD-10-CM

## 2023-01-11 DIAGNOSIS — Z1389 Encounter for screening for other disorder: Secondary | ICD-10-CM

## 2023-01-11 DIAGNOSIS — E282 Polycystic ovarian syndrome: Secondary | ICD-10-CM

## 2023-01-11 DIAGNOSIS — E663 Overweight: Secondary | ICD-10-CM

## 2023-01-11 DIAGNOSIS — Z1329 Encounter for screening for other suspected endocrine disorder: Secondary | ICD-10-CM

## 2023-01-11 DIAGNOSIS — L709 Acne, unspecified: Secondary | ICD-10-CM

## 2023-02-16 NOTE — Progress Notes (Signed)
Patient ID: Katrina Baker, female   DOB: [birth year], 27 y.o.   MRN: [id number] COMPLETE PHYSICAL   Assessment and Plan   Katrina Baker was seen today for annual exam.  Diagnoses and all orders for this visit:  Encounter for general adult medical examination with abnormal findings Due yearly  Overweight (BMI 25.0-29.9) Fair life protein shakes Eat more frequently - try not to go more than 6 hours without protein Aim for 90 grams of protein a day- 30 breakfast/30 lunch 30 dinner Try to keep net carbs less than 50 Net Carbs=Total Carbs-fiber- sugar alcohols Exercise heartrate 120-140(fat burning zone)- walking 20-30 minutes 4 days a week   B12 deficiency Continue supplementation  Hyperlipidemia, unspecified hyperlipidemia type Continue diet and exercise - lipid panel  Vitamin D deficiency Continue Vit D supplementation to maintain value in therapeutic level of 60-100   PCOS (polycystic ovarian syndrome) Follow with GYN and is currently off birth control Could not tolerate Metformin  Migraine without status migrainosus, not intractable, unspecified migraine type Better controlled with diet and exercise, uses Maxalt rarely   Acne Currently not on medication   Medication management -     CBC with Differential/Platelet -     COMPLETE METABOLIC PANEL WITH GFR -     Lipid panel -     Magnesium -     TSH -     Urinalysis, Routine w reflex microscopic  Screening for hematuria or proteinuria -     Urinalysis, Routine w reflex microscopic  Screening for thyroid disorder -     TSH     Annual Screening Comprehensive Examination   This very nice 27 y.o.female presents for complete physical. She has PCOS (polycystic ovarian syndrome); Overweight (BMI 25.0-29.9); Onychomycosis; B12 deficiency; Migraine without status migrainosus, not intractable; Hyperlipidemia; and Vitamin D deficiency on their problem list.   Patient reports no complaints at this time.   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  She is staying  in the [place] now, uncertain of career path.   Her 1st apartment had a large mold apartment, she lived there for 5 months. 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    She does have a history of PCOS, diagnosed when she was 13. She follows with GYN Dr[facility]. Off of metformin, made her feel bad and didn't help, now on xulane weekly patch. She has appointment with Dr. Renaldo Fiddler to follow up about birth control   BMI is Body mass index is 29.57 kg/m.,She is up 14 pounds but was eating cleaner when in [place]. She is interested in losing weight.  Has been doing diet changes of decreased fat and carbs. She is doing pilates Wt Readings from Last 3 Encounters:  02/20/23 175 lb (79.4 kg)  01/10/22 161 lb 6.4 oz (73.2 kg)  07/15/20 173 lb (78.5 kg)   She does have a history of migraines, states has improved with ear piercing. She has hless migraines. Still will occasionally need to use Maxalt    Today their BP is BP: 112/74  BP Readings from Last 3 Encounters:  02/20/23 112/74  01/10/22 124/60  07/15/20 136/74  She does workout. She denies chest pain, shortness of breath, dizziness. Hx of vasovagal syncope, none in last 3 years.     She is not on cholesterol medication  and denies myalgias. Her cholesterol is not at goal. She limits red meat , dairy, eggs and fried foods. The cholesterol last visit was:   Lab Results  Component Value Date   CHOL 192 04/22/2020   HDL 70 04/22/2020   LDLCALC 103 (H) 04/22/2020   TRIG 95 04/22/2020   CHOLHDL 2.7 04/22/2020     She has been working on diet and exercise for glucose management, and denies increased appetite, nausea, paresthesia of the feet, polydipsia, polyuria and visual disturbances. Last A1C in the office was:  Lab Results  Component Value Date    HGBA1C 5.0 03/28/2016   Patient is on Vitamin D supplement, takes 1000 IU but forgets  Lab Results  Component Value Date   VD25OH 22 (L) 04/22/2020     She takes 1000 mcg tablet, admits forgets to take Lab Results  Component Value Date   VITAMINB12 300 04/22/2020       Current Outpatient Medications on File Prior to Visit  Medication Sig Dispense Refill   cholecalciferol (VITAMIN D3) 25 MCG (1000 UNIT) tablet Take 1,000 Units by mouth daily.     cyanocobalamin 1000 MCG tablet Vitamin B-12  1,000 mcg tablet  Take by oral route.     naproxen (NAPROSYN) 500 MG tablet Take 500 mg by mouth daily as needed.     rizatriptan (MAXALT) 10 MG tablet Take 1 tablet (10 mg total) by mouth as needed for migraine. May repeat in 2 hours if needed 10 tablet 1   albuterol (VENTOLIN HFA) 108 (90 Base) MCG/ACT inhaler INHALE 2 PUFFS INTO THE LUNGS EVERY 4 HOURS AS NEEDED FOR WHEEZING OR SHORTNESS OF BREATH 18 g 0   isotretinoin (ACCUTANE) 10 MG capsule Take 10 mg by mouth 2 (two) times daily. (Patient not taking: Reported on 02/20/2023)     neomycin-polymyxin-hydrocortisone (CORTISPORIN) 3.5-10000-1 OTIC suspension Apply 1-2 drops daily after soaking and cover with bandaid (Patient not taking: Reported on 01/10/2022) 10 mL 0   XULANE 150-35 MCG/24HR transdermal patch Place 1 patch onto the skin once a week. (Patient not taking: Reported on 02/20/2023) 13 patch 3   No current facility-administered medications on file prior to visit.   Immunization History  Administered Date(s) Administered   DTaP 05/02/1997, 07/04/1997, 09/04/1997, 07/21/1998, 02/28/2001   HIB (PRP-OMP) 05/02/1997, 07/04/1997, 07/21/1998   Hepatitis A 06/09/2005, 05/30/2006   Hepatitis B 03-15-1997, 04/01/1997, 09/04/1997   Hpv-Unspecified 08/11/2009, 10/16/2009, 02/26/2010   Influenza Inj Mdck Quad Pf 04/12/2019   Influenza-Unspecified 03/10/2014, 03/14/2017, 03/25/2020   MMR 03/13/1998, 02/28/2001   MenQuadfi_Meningococcal Groups ACYW  Conjugate 07/25/2008, 08/15/2014   PFIZER(Purple Top)SARS-COV-2 Vaccination 06/27/2019, 07/16/2019, 03/13/2020   Td 07/19/2007   Tdap 07/19/2007   Varicella 03/13/1998, 05/30/2006   Health Maintenance  Topic Date Due   DTaP/Tdap/Td (8 - Td or Tdap) 07/18/2017   INFLUENZA VACCINE  01/12/2023   COVID-19 Vaccine (4 - 2023-24 season) 02/12/2023   PAP-Cervical Cytology Screening  02/05/2023   PAP SMEAR-Modifier  02/05/2023   HPV VACCINES  Completed   Hepatitis C Screening  Discontinued   HIV Screening  Discontinued     Last eye: 2021, no issues  Last dental: 2021, goes q51m  Patient's last menstrual period was 02/15/2023. Never sexually active   Allergies No Known Allergies  SURGICAL HISTORY She  has a past surgical history that includes No past surgeries.   FAMILY HISTORY Her family history includes COPD in her maternal grandmother; Cancer in her maternal aunt; Diabetes in her father; Factor V Leiden  deficiency in her mother and another family member; Heart Problems in her mother; High Cholesterol in her father; High blood pressure in her father; Hypertension in her maternal grandfather; Lung cancer in her paternal grandmother.   SOCIAL HISTORY She  reports that she has never smoked. She has never used smokeless tobacco. She reports that she does not currently use alcohol. She reports that she does not use drugs.    Review of Systems  Constitutional:  Negative for chills, fever and malaise/fatigue.  HENT:  Negative for congestion, ear pain and sore throat.   Respiratory:  Negative for cough, shortness of breath and wheezing.   Cardiovascular:  Negative for chest pain, palpitations and leg swelling.  Gastrointestinal:  Negative for abdominal pain, blood in stool, constipation, diarrhea, heartburn and melena.  Genitourinary:  Negative for dysuria, frequency, hematuria and urgency.  Skin: Negative.   Neurological:  Positive for headaches (rare, L sided migraines). Negative for  dizziness, sensory change and loss of consciousness.  Psychiatric/Behavioral:  Negative for depression. The patient is not nervous/anxious and does not have insomnia.      Physical Exam  BP 112/74   Pulse 83   Temp 97.7 F (36.5 C)   Ht 5' 4.5" (1.638 m)   Wt 175 lb (79.4 kg)   LMP 02/15/2023   SpO2 97%   BMI 29.57 kg/m   General Appearance: Well nourished and in no apparent distress. Eyes: PERRLA, EOMs, conjunctiva no swelling or erythema Sinuses: No frontal/maxillary tenderness ENT/Mouth: EACs patent / TMs  nl. Nares clear without erythema, swelling, mucoid exudates. Oral hygiene is good. No erythema, swelling, or exudate. Tongue normal, non-obstructing. Tonsils not swollen or erythematous. Hearing normal.  Neck: Supple, thyroid normal. No bruits, nodes or JVD. Respiratory: Respiratory effort normal.  BS equal and clear bilateral without rales, rhonci, wheezing or stridor. Cardio: Heart sounds are normal with regular rate and rhythm and no murmurs, rubs or gallops. Peripheral pulses are normal and equal bilaterally without edema. No aortic or femoral bruits. Chest: symmetric with normal excursions and percussion..  Abdomen: Flat, soft, with bowl sounds. Nontender, no guarding, rebound, hernias, masses, or organomegaly.  Lymphatics: Non tender without lymphadenopathy.  Musculoskeletal: Full ROM all peripheral extremities, joint stability, 5/5 strength, and normal gait. Skin: Warm and dry without rashes, lesions, cyanosis, clubbing or  ecchymosis. Bil great toenails thickened and yellow  Neuro: Cranial nerves intact, reflexes equal bilaterally. Normal muscle tone, no cerebellar symptoms. Sensation intact.  Pysch: Awake and oriented X 3, normal affect, Insight and Judgment appropriate.  Breast/GU: deferred to GYN  Raynelle Dick, NP 10:27 AM [facility]

## 2023-02-20 ENCOUNTER — Encounter: Payer: Self-pay | Admitting: Nurse Practitioner

## 2023-02-20 ENCOUNTER — Ambulatory Visit (INDEPENDENT_AMBULATORY_CARE_PROVIDER_SITE_OTHER): Payer: Self-pay | Admitting: Nurse Practitioner

## 2023-02-20 VITALS — BP 112/74 | HR 83 | Temp 97.7°F | Ht 64.5 in | Wt 175.0 lb

## 2023-02-20 DIAGNOSIS — Z0001 Encounter for general adult medical examination with abnormal findings: Secondary | ICD-10-CM

## 2023-02-20 DIAGNOSIS — L709 Acne, unspecified: Secondary | ICD-10-CM

## 2023-02-20 DIAGNOSIS — E785 Hyperlipidemia, unspecified: Secondary | ICD-10-CM

## 2023-02-20 DIAGNOSIS — E663 Overweight: Secondary | ICD-10-CM

## 2023-02-20 DIAGNOSIS — Z1329 Encounter for screening for other suspected endocrine disorder: Secondary | ICD-10-CM

## 2023-02-20 DIAGNOSIS — E559 Vitamin D deficiency, unspecified: Secondary | ICD-10-CM

## 2023-02-20 DIAGNOSIS — Z1389 Encounter for screening for other disorder: Secondary | ICD-10-CM

## 2023-02-20 DIAGNOSIS — Z79899 Other long term (current) drug therapy: Secondary | ICD-10-CM

## 2023-02-20 DIAGNOSIS — G43909 Migraine, unspecified, not intractable, without status migrainosus: Secondary | ICD-10-CM

## 2023-02-20 DIAGNOSIS — E538 Deficiency of other specified B group vitamins: Secondary | ICD-10-CM

## 2023-02-20 DIAGNOSIS — E282 Polycystic ovarian syndrome: Secondary | ICD-10-CM

## 2023-02-20 NOTE — Patient Instructions (Signed)
Fair life protein shakes Eat more frequently - try not to go more than 6 hours without protein Aim for 90 grams of protein a day- 30 breakfast/30 lunch 30 dinner Try to keep net carbs less than 50 Net Carbs=Total Carbs-fiber- sugar alcohols Exercise heartrate 120-140(fat burning zone)- walking 20-30 minutes 4 days a week  

## 2023-02-21 LAB — URINALYSIS, ROUTINE W REFLEX MICROSCOPIC
Bilirubin Urine: NEGATIVE
Glucose, UA: NEGATIVE
Hgb urine dipstick: NEGATIVE
Ketones, ur: NEGATIVE
Leukocytes,Ua: NEGATIVE
Nitrite: NEGATIVE
Protein, ur: NEGATIVE
Specific Gravity, Urine: 1.003 (ref 1.001–1.035)
pH: 7 (ref 5.0–8.0)

## 2023-02-21 LAB — CBC WITH DIFFERENTIAL/PLATELET
Absolute Monocytes: 301 {cells}/uL (ref 200–950)
Basophils Absolute: 19 {cells}/uL (ref 0–200)
Basophils Relative: 0.3 %
Eosinophils Absolute: 38 {cells}/uL (ref 15–500)
Eosinophils Relative: 0.6 %
HCT: 44.7 % (ref 35.0–45.0)
Hemoglobin: 15 g/dL (ref 11.7–15.5)
Lymphs Abs: 1107 {cells}/uL (ref 850–3900)
MCH: 30.4 pg (ref 27.0–33.0)
MCHC: 33.6 g/dL (ref 32.0–36.0)
MCV: 90.7 fL (ref 80.0–100.0)
MPV: 9.5 fL (ref 7.5–12.5)
Monocytes Relative: 4.7 %
Neutro Abs: 4934 {cells}/uL (ref 1500–7800)
Neutrophils Relative %: 77.1 %
Platelets: 308 10*3/uL (ref 140–400)
RBC: 4.93 10*6/uL (ref 3.80–5.10)
RDW: 12.1 % (ref 11.0–15.0)
Total Lymphocyte: 17.3 %
WBC: 6.4 10*3/uL (ref 3.8–10.8)

## 2023-02-21 LAB — LIPID PANEL
Cholesterol: 203 mg/dL — ABNORMAL HIGH (ref <200)
HDL: 70 mg/dL (ref >=50)
LDL Cholesterol (Calc): 117 mg/dL — ABNORMAL HIGH
Non-HDL Cholesterol (Calc): 133 mg/dL — ABNORMAL HIGH (ref <130)
Total CHOL/HDL Ratio: 2.9 (calc) (ref <5.0)
Triglycerides: 70 mg/dL (ref <150)

## 2023-02-21 LAB — COMPLETE METABOLIC PANEL WITH GFR
AG Ratio: 1.8 (calc) (ref 1.0–2.5)
ALT: 13 U/L (ref 6–29)
AST: 16 U/L (ref 10–30)
Albumin: 4.8 g/dL (ref 3.6–5.1)
Alkaline phosphatase (APISO): 81 U/L (ref 31–125)
BUN: 9 mg/dL (ref 7–25)
CO2: 25 mmol/L (ref 20–32)
Calcium: 9.7 mg/dL (ref 8.6–10.2)
Chloride: 105 mmol/L (ref 98–110)
Creat: 0.77 mg/dL (ref 0.50–0.96)
Globulin: 2.7 g/dL (ref 1.9–3.7)
Glucose, Bld: 87 mg/dL (ref 65–99)
Potassium: 4.5 mmol/L (ref 3.5–5.3)
Sodium: 142 mmol/L (ref 135–146)
Total Bilirubin: 0.6 mg/dL (ref 0.2–1.2)
Total Protein: 7.5 g/dL (ref 6.1–8.1)
eGFR: 110 mL/min/{1.73_m2} (ref >=60)

## 2023-02-21 LAB — TSH: TSH: 1.01 m[IU]/L

## 2023-02-21 LAB — MAGNESIUM: Magnesium: 2.1 mg/dL (ref 1.5–2.5)

## 2023-05-20 ENCOUNTER — Encounter: Payer: Self-pay | Admitting: Nurse Practitioner

## 2023-06-02 ENCOUNTER — Encounter: Payer: Self-pay | Admitting: Nurse Practitioner

## 2023-07-11 ENCOUNTER — Other Ambulatory Visit (HOSPITAL_COMMUNITY): Payer: Self-pay

## 2023-07-11 DIAGNOSIS — Z6832 Body mass index (BMI) 32.0-32.9, adult: Secondary | ICD-10-CM | POA: Diagnosis not present

## 2023-07-11 DIAGNOSIS — Z01419 Encounter for gynecological examination (general) (routine) without abnormal findings: Secondary | ICD-10-CM | POA: Diagnosis not present

## 2023-07-11 DIAGNOSIS — E282 Polycystic ovarian syndrome: Secondary | ICD-10-CM | POA: Diagnosis not present

## 2023-07-11 LAB — HM PAP SMEAR: HM Pap smear: NORMAL

## 2023-07-11 LAB — RESULTS CONSOLE HPV: CHL HPV: NEGATIVE

## 2023-07-11 MED ORDER — XULANE 150-35 MCG/24HR TD PTWK
MEDICATED_PATCH | TRANSDERMAL | 3 refills | Status: DC
  Filled 2023-07-11: qty 9, 84d supply, fill #0
  Filled 2023-10-12: qty 9, 84d supply, fill #1
  Filled 2024-01-01: qty 9, 84d supply, fill #2
  Filled 2024-03-25 – 2024-04-11 (×2): qty 9, 84d supply, fill #3

## 2023-07-12 ENCOUNTER — Other Ambulatory Visit (HOSPITAL_COMMUNITY): Payer: Self-pay

## 2023-07-13 ENCOUNTER — Other Ambulatory Visit (HOSPITAL_COMMUNITY): Payer: Self-pay

## 2023-07-13 ENCOUNTER — Other Ambulatory Visit: Payer: Self-pay

## 2023-07-14 ENCOUNTER — Other Ambulatory Visit: Payer: Self-pay

## 2023-07-14 ENCOUNTER — Other Ambulatory Visit (HOSPITAL_COMMUNITY): Payer: Self-pay

## 2023-07-17 ENCOUNTER — Other Ambulatory Visit: Payer: Self-pay

## 2023-10-12 ENCOUNTER — Other Ambulatory Visit (HOSPITAL_COMMUNITY): Payer: Self-pay

## 2023-10-12 ENCOUNTER — Other Ambulatory Visit: Payer: Self-pay

## 2023-10-13 ENCOUNTER — Other Ambulatory Visit (HOSPITAL_COMMUNITY): Payer: Self-pay

## 2023-10-19 ENCOUNTER — Encounter: Payer: Self-pay | Admitting: General Practice

## 2023-10-19 ENCOUNTER — Other Ambulatory Visit (HOSPITAL_COMMUNITY): Payer: Self-pay

## 2023-10-19 ENCOUNTER — Ambulatory Visit: Payer: Self-pay | Admitting: General Practice

## 2023-10-19 VITALS — BP 104/80 | HR 106 | Temp 99.1°F | Ht 65.0 in | Wt 204.0 lb

## 2023-10-19 DIAGNOSIS — E282 Polycystic ovarian syndrome: Secondary | ICD-10-CM | POA: Diagnosis not present

## 2023-10-19 DIAGNOSIS — G43909 Migraine, unspecified, not intractable, without status migrainosus: Secondary | ICD-10-CM

## 2023-10-19 DIAGNOSIS — Z6833 Body mass index (BMI) 33.0-33.9, adult: Secondary | ICD-10-CM | POA: Diagnosis not present

## 2023-10-19 DIAGNOSIS — Z23 Encounter for immunization: Secondary | ICD-10-CM | POA: Diagnosis not present

## 2023-10-19 DIAGNOSIS — E66811 Obesity, class 1: Secondary | ICD-10-CM | POA: Diagnosis not present

## 2023-10-19 DIAGNOSIS — E6609 Other obesity due to excess calories: Secondary | ICD-10-CM

## 2023-10-19 DIAGNOSIS — Z7689 Persons encountering health services in other specified circumstances: Secondary | ICD-10-CM | POA: Insufficient documentation

## 2023-10-19 DIAGNOSIS — E785 Hyperlipidemia, unspecified: Secondary | ICD-10-CM | POA: Diagnosis not present

## 2023-10-19 MED ORDER — RIZATRIPTAN BENZOATE 10 MG PO TABS
10.0000 mg | ORAL_TABLET | ORAL | 1 refills | Status: AC | PRN
  Filled 2023-10-19: qty 10, 20d supply, fill #0

## 2023-10-19 NOTE — Progress Notes (Signed)
 New Patient Office Visit  Subjective    Patient ID: Katrina NEIDERHISER, female    DOB: [birth year]  Age: 28 y.o. MRN: [id number]  CC:  Chief Complaint  Patient presents with   New Patient (Initial Visit)   Obesity    Wants to discuss options    HPI Katrina Baker is a 28 y.o. female presents to establish care.  Last PCP/physical/labs: Cassondra Cliff and Myrle Aspen in September 2024.   Migraines: diagnosed many years ago. Episodes occur once or twice a month. Overall doing well. She has been managed on Maxalt  10 mg as needed. She has been tolerating it well. She would like to a refill.   PCOS- followed by gynecology. Currently on Xulane  150-35 mcg patch once weekly. Has tried metformin  but did not take for long due to side effects. She had to stop the Xulane  while in [place]. She gained about 30 lbs due to the new birth control.   High cholesterol- in September. Reports that she gained about 30 lbs while in [place]. Suspect this could be secondary to that. She has been monitoring her diet. She has been exercising three times a week and goes walking more and doing piliates.  Outpatient Encounter Medications as of 10/19/2023  Medication Sig   cholecalciferol (VITAMIN D3) 25 MCG (1000 UNIT) tablet Take 1,000 Units by mouth daily.   cyanocobalamin 1000 MCG tablet Vitamin B-12  1,000 mcg tablet  Take by oral route.   naproxen (NAPROSYN) 500 MG tablet Take 500 mg by mouth daily as needed.   XULANE  150-35 MCG/24HR transdermal patch APPLY 1 PATCH TO SKIN ONCE WEEKLY AS DIRECTED   [DISCONTINUED] rizatriptan  (MAXALT ) 10 MG tablet Take 1 tablet (10 mg total) by mouth as needed for migraine. May repeat in 2 hours if needed   rizatriptan  (MAXALT ) 10 MG tablet Take 1 tablet (10 mg total) by mouth as needed for migraine. May repeat in 2 hours if needed   [DISCONTINUED] albuterol  (VENTOLIN  HFA) 108 (90 Base) MCG/ACT inhaler INHALE 2 PUFFS INTO THE LUNGS EVERY 4 HOURS AS NEEDED FOR WHEEZING OR  SHORTNESS OF BREATH   No facility-administered encounter medications on file as of 10/19/2023.    Past Medical History:  Diagnosis Date   Allergy    Encounter to establish care 10/19/2023   Hyperlipidemia 04/22/2021   Vasovagal syncope 06/16/2016    Past Surgical History:  Procedure Laterality Date   NO PAST SURGERIES      Family History  Problem Relation Age of Onset   Lung cancer Paternal Grandmother        former smoker   Cancer Maternal Aunt    Heart Problems Mother    Factor V Leiden deficiency Mother    Diabetes Father    High blood pressure Father    High Cholesterol Father    COPD Maternal Grandmother    Hypertension Maternal Grandfather    Factor V Leiden deficiency Other        multiple maternal relatives with factor V def    Social History   Socioeconomic History   Marital status: Single    Spouse name: Not on file   Number of children: Not on file   Years of education: Not on file   Highest education level: Not on file  Occupational History   Not on file  Tobacco Use   Smoking status: Never   Smokeless tobacco: Never  Vaping Use   Vaping status: Never Used  Substance and Sexual Activity  Alcohol use: Not Currently    Alcohol/week: 0.0 standard drinks of alcohol    Comment: rare, social    Drug use: No   Sexual activity: Not Currently    Partners: Male    Birth control/protection: Pill, Patch  Other Topics Concern   Not on file  Social History Narrative   Epworth Scale Score: 2   Social Drivers of Health   Financial Resource Strain: Not on file  Food Insecurity: Not on file  Transportation Needs: Not on file  Physical Activity: Not on file  Stress: Not on file  Social Connections: Not on file  Intimate Partner Violence: Not on file    Review of Systems  Constitutional:  Negative for chills and fever.  Respiratory:  Negative for shortness of breath.   Cardiovascular:  Negative for chest pain.  Gastrointestinal:  Negative for abdominal  pain, constipation, diarrhea, heartburn, nausea and vomiting.  Genitourinary:  Negative for dysuria, frequency and urgency.  Neurological:  Negative for dizziness and headaches.  Endo/Heme/Allergies:  Negative for polydipsia.  Psychiatric/Behavioral:  Negative for depression and suicidal ideas. The patient is not nervous/anxious.         Objective    BP 104/80 (BP Location: Left Arm, Patient Position: Sitting, Cuff Size: Normal)   Pulse (!) 106   Temp 99.1 F (37.3 C) (Oral)   Ht 5\' 5"  (1.651 m)   Wt 204 lb (92.5 kg)   SpO2 96%   BMI 33.95 kg/m   Physical Exam Vitals and nursing note reviewed.  Constitutional:      Appearance: Normal appearance.  Cardiovascular:     Rate and Rhythm: Normal rate and regular rhythm.     Pulses: Normal pulses.     Heart sounds: Normal heart sounds.  Pulmonary:     Effort: Pulmonary effort is normal.     Breath sounds: Normal breath sounds.  Neurological:     Mental Status: She is alert and oriented to person, place, and time.  Psychiatric:        Mood and Affect: Mood normal.        Behavior: Behavior normal.        Thought Content: Thought content normal.        Judgment: Judgment normal.         Assessment & Plan:  Need for Tdap vaccination -     Tdap vaccine greater than or equal to 7yo IM  Migraine without status migrainosus, not intractable, unspecified migraine type Assessment & Plan: Controlled.   Occurs usually 1-2 a month.  Refill sent for Maxalt  10 mg PRN.  Orders: -     Rizatriptan  Benzoate; Take 1 tablet (10 mg total) by mouth as needed for migraine. May repeat in 2 hours if needed  Dispense: 10 tablet; Refill: 1  Encounter to establish care Assessment & Plan: EMR reviewed briefly.    Hyperlipidemia, unspecified hyperlipidemia type Assessment & Plan: Slightly elevated in September, 2024.  Has made life style modifications since then.   Will check in September at her physical.   PCOS (polycystic ovarian  syndrome) Assessment & Plan: Controlled.  Followed by gyn.   Off of meds.    Class 1 obesity due to excess calories with serious comorbidity and body mass index (BMI) of 33.0 to 33.9 in adult Assessment & Plan: Long discussion about weight loss, diet, and exercise Recommended diet heavy in fruits and veggies and low in animal meats, cheeses, and dairy products, appropriate calorie intake Patient will work on  decreasing saturated fats and simple carbs  Increase lean proteins and activity  Consider referral for healthy weight and wellness.      Return in about 4 months (around 02/21/2024) for physical.   Jolanda Nation, NP

## 2023-10-19 NOTE — Patient Instructions (Addendum)
 Refill sent for Maxalt  as needed.   Continue to monitor diet and eat a low fat low cholesterol diet.  Schedule physical in September.   It was a pleasure to meet you today! Please don't hesitate to contact me with any questions. Welcome to [facility]!

## 2023-10-19 NOTE — Assessment & Plan Note (Signed)
 EMR reviewed briefly.

## 2023-10-19 NOTE — Assessment & Plan Note (Signed)
 Long discussion about weight loss, diet, and exercise Recommended diet heavy in fruits and veggies and low in animal meats, cheeses, and dairy products, appropriate calorie intake Patient will work on decreasing saturated fats and simple carbs  Increase lean proteins and activity  Consider referral for healthy weight and wellness.

## 2023-10-19 NOTE — Assessment & Plan Note (Signed)
 Controlled.   Occurs usually 1-2 a month.  Refill sent for Maxalt  10 mg PRN.

## 2023-10-19 NOTE — Assessment & Plan Note (Signed)
 Slightly elevated in September, 2024.  Has made life style modifications since then.   Will check in September at her physical.

## 2023-10-19 NOTE — Assessment & Plan Note (Addendum)
 Controlled.  Followed by gyn.   Continue Off of meds.

## 2023-12-10 DIAGNOSIS — W25XXXA Contact with sharp glass, initial encounter: Secondary | ICD-10-CM | POA: Diagnosis not present

## 2023-12-10 DIAGNOSIS — M79672 Pain in left foot: Secondary | ICD-10-CM | POA: Diagnosis not present

## 2023-12-10 DIAGNOSIS — S91312A Laceration without foreign body, left foot, initial encounter: Secondary | ICD-10-CM | POA: Diagnosis not present

## 2023-12-10 DIAGNOSIS — R030 Elevated blood-pressure reading, without diagnosis of hypertension: Secondary | ICD-10-CM | POA: Diagnosis not present

## 2024-01-01 ENCOUNTER — Other Ambulatory Visit: Payer: Self-pay

## 2024-01-15 ENCOUNTER — Encounter: Payer: Self-pay | Admitting: Nurse Practitioner

## 2024-01-23 ENCOUNTER — Ambulatory Visit: Payer: Self-pay

## 2024-01-23 ENCOUNTER — Encounter: Payer: Self-pay | Admitting: General Practice

## 2024-01-23 NOTE — Telephone Encounter (Signed)
 FYI for appt tomorrow with Dr. Avelina

## 2024-01-23 NOTE — Telephone Encounter (Signed)
 FYI Only or Action Required?: Action required by provider: clinical question for provider.  Patient was last seen in primary care on 10/19/2023 by Vincente Shivers, NP.  Called Nurse Triage reporting Hand foot mouth.  Symptoms began several days ago.  Interventions attempted: OTC medications: Tylenol.  Symptoms are: gradually improving.  Triage Disposition: See Physician Within 24 Hours  Patient/caregiver understands and will follow disposition?:   Copied from CRM #[id number]. Topic: Clinical - Red Word Triage >> Jan 23, 2024  2:38 PM Jayma L wrote: Red Word that prompted transfer to Nurse Triage:   Patient called in and said she has hand, foot and mouth disease. 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 she has blisters or red spots around her mouth and some on her feet and back of thighs. There is some new swelling around the red spots, no pain unless she pushes on spots and no bleeding . Asking for a video visit if possible. Reason for Disposition  [1] Rash has spread beyond hands, feet and mouth AND [2] has not been examined by a doctor (or NP/PA)  Answer Assessment - Initial Assessment Questions 1. MOUTH SORES (ULCERS): Are there any sores in the mouth? If so, ask: What do they look like? Where are they located?     Blisters 2. APPEARANCE of RASH: What does the rash look like? (e.g., spots, blisters, raised areas, skin peeling, scaly)     Blisters with surrounding redness. None are draining.  3. LOCATION: Where is the rash located?      Mouth, feet, thighs 4. COLOR: What color is the rash? Note: It is difficult to assess rash color in people with darker-colored skin. When this situation occurs, simply ask the caller to describe what they see.     red 5. ONSET: When did the rash begin?     Sunday 6. FEVER: Do you have a fever? If Yes, ask: What is your temperature, how was it measured, and when did it start?     Denies-had fever Saturday and Sunday 7. HAND  FOOT AND MOUTH DISEASE EXPOSURE: Has there been any exposure to Hand Foot and Mouth Disease within the past week? If Yes, ask: What type of contact occurred?     Yes, works in peds 8. MEDICATION FACTORS: Have you started any new medicines within the last 2 weeks? (e.g., antibiotics)      no 9. BETTER-SAME-WORSE: Are you getting better, staying the same or getting worse compared to yesterday?  If getting worse, ask, In what way?     Same 10. OTHER SYMPTOMS: Do you have any other symptoms? 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 Fever free since Sunday, still has rash, no drainage. Advised quarantine until fever free x 24 hours without medications and blisters healed. Patient requesting from pcp recommendation on quarantine time, can she return to work tomorrow? Please advise.  Protocols used: Hand Foot and Mouth Disease - Diagnosed or Suspected-A-AH

## 2024-01-24 ENCOUNTER — Ambulatory Visit: Admitting: Family Medicine

## 2024-02-20 ENCOUNTER — Encounter: Payer: Self-pay | Admitting: Nurse Practitioner

## 2024-02-21 ENCOUNTER — Telehealth: Payer: Self-pay | Admitting: General Practice

## 2024-02-21 NOTE — Telephone Encounter (Signed)
 Copied from CRM [id number]. Topic: Appointments - Appointment Cancel/Reschedule >> Feb 21, 2024  8:43 AM Charlet HERO wrote: Patient/patient representative is calling to reschedule an appointment. Refer to attachments for appointment information.

## 2024-02-22 ENCOUNTER — Encounter: Admitting: General Practice

## 2024-03-25 ENCOUNTER — Other Ambulatory Visit: Payer: Self-pay

## 2024-03-27 ENCOUNTER — Other Ambulatory Visit (HOSPITAL_COMMUNITY): Payer: Self-pay

## 2024-03-27 ENCOUNTER — Other Ambulatory Visit: Payer: Self-pay

## 2024-03-27 ENCOUNTER — Encounter: Payer: Self-pay | Admitting: Pharmacist

## 2024-04-01 ENCOUNTER — Other Ambulatory Visit: Payer: Self-pay

## 2024-04-09 ENCOUNTER — Other Ambulatory Visit: Payer: Self-pay

## 2024-04-11 ENCOUNTER — Other Ambulatory Visit: Payer: Self-pay

## 2024-04-12 ENCOUNTER — Other Ambulatory Visit (HOSPITAL_COMMUNITY): Payer: Self-pay

## 2024-05-02 ENCOUNTER — Ambulatory Visit (INDEPENDENT_AMBULATORY_CARE_PROVIDER_SITE_OTHER): Admitting: General Practice

## 2024-05-02 ENCOUNTER — Other Ambulatory Visit: Payer: Self-pay

## 2024-05-02 ENCOUNTER — Encounter: Payer: Self-pay | Admitting: General Practice

## 2024-05-02 ENCOUNTER — Other Ambulatory Visit (HOSPITAL_COMMUNITY): Payer: Self-pay

## 2024-05-02 VITALS — BP 132/72 | HR 110 | Temp 97.9°F | Ht 65.0 in | Wt 203.2 lb

## 2024-05-02 DIAGNOSIS — E538 Deficiency of other specified B group vitamins: Secondary | ICD-10-CM

## 2024-05-02 DIAGNOSIS — E6609 Other obesity due to excess calories: Secondary | ICD-10-CM | POA: Diagnosis not present

## 2024-05-02 DIAGNOSIS — E66811 Obesity, class 1: Secondary | ICD-10-CM | POA: Diagnosis not present

## 2024-05-02 DIAGNOSIS — Z833 Family history of diabetes mellitus: Secondary | ICD-10-CM | POA: Insufficient documentation

## 2024-05-02 DIAGNOSIS — Z6833 Body mass index (BMI) 33.0-33.9, adult: Secondary | ICD-10-CM | POA: Diagnosis not present

## 2024-05-02 DIAGNOSIS — Z803 Family history of malignant neoplasm of breast: Secondary | ICD-10-CM | POA: Insufficient documentation

## 2024-05-02 DIAGNOSIS — Z13220 Encounter for screening for lipoid disorders: Secondary | ICD-10-CM

## 2024-05-02 DIAGNOSIS — E559 Vitamin D deficiency, unspecified: Secondary | ICD-10-CM | POA: Diagnosis not present

## 2024-05-02 DIAGNOSIS — Z0000 Encounter for general adult medical examination without abnormal findings: Secondary | ICD-10-CM | POA: Diagnosis not present

## 2024-05-02 DIAGNOSIS — F419 Anxiety disorder, unspecified: Secondary | ICD-10-CM | POA: Diagnosis not present

## 2024-05-02 LAB — COMPREHENSIVE METABOLIC PANEL WITH GFR
ALT: 11 U/L (ref 0–35)
AST: 15 U/L (ref 0–37)
Albumin: 4.5 g/dL (ref 3.5–5.2)
Alkaline Phosphatase: 71 U/L (ref 39–117)
BUN: 11 mg/dL (ref 6–23)
CO2: 26 meq/L (ref 19–32)
Calcium: 9.1 mg/dL (ref 8.4–10.5)
Chloride: 103 meq/L (ref 96–112)
Creatinine, Ser: 0.84 mg/dL (ref 0.40–1.20)
GFR: 95.4 mL/min (ref >60.00)
Glucose, Bld: 82 mg/dL (ref 70–99)
Potassium: 3.7 meq/L (ref 3.5–5.1)
Sodium: 138 meq/L (ref 135–145)
Total Bilirubin: 0.5 mg/dL (ref 0.2–1.2)
Total Protein: 7.6 g/dL (ref 6.0–8.3)

## 2024-05-02 LAB — LIPID PANEL
Cholesterol: 221 mg/dL — ABNORMAL HIGH (ref 0–200)
HDL: 69.5 mg/dL (ref >39.00)
LDL Cholesterol: 121 mg/dL — ABNORMAL HIGH (ref 0–99)
NonHDL: 151.64
Total CHOL/HDL Ratio: 3
Triglycerides: 153 mg/dL — ABNORMAL HIGH (ref 0.0–149.0)
VLDL: 30.6 mg/dL (ref 0.0–40.0)

## 2024-05-02 LAB — CBC
HCT: 42.7 % (ref 36.0–46.0)
Hemoglobin: 14.5 g/dL (ref 12.0–15.0)
MCHC: 33.9 g/dL (ref 30.0–36.0)
MCV: 88.2 fl (ref 78.0–100.0)
Platelets: 320 K/uL (ref 150.0–400.0)
RBC: 4.84 Mil/uL (ref 3.87–5.11)
RDW: 13.2 % (ref 11.5–15.5)
WBC: 5.4 K/uL (ref 4.0–10.5)

## 2024-05-02 LAB — VITAMIN D 25 HYDROXY (VIT D DEFICIENCY, FRACTURES): VITD: 23.58 ng/mL — ABNORMAL LOW (ref 30.00–100.00)

## 2024-05-02 LAB — HEMOGLOBIN A1C: Hgb A1c MFr Bld: 5.3 % (ref 4.6–6.5)

## 2024-05-02 LAB — VITAMIN B12: Vitamin B-12: 406 pg/mL (ref 211–911)

## 2024-05-02 LAB — TSH: TSH: 3.28 u[IU]/mL (ref 0.35–5.50)

## 2024-05-02 MED ORDER — SERTRALINE HCL 50 MG PO TABS
50.0000 mg | ORAL_TABLET | Freq: Every day | ORAL | 0 refills | Status: AC
  Filled 2024-05-02: qty 30, 30d supply, fill #0

## 2024-05-02 NOTE — Assessment & Plan Note (Signed)
Vitamin d level pending

## 2024-05-02 NOTE — Assessment & Plan Note (Addendum)
 Vitamin b 12 level pending.

## 2024-05-02 NOTE — Progress Notes (Signed)
 Established Patient Office Visit  Subjective   Patient ID: Katrina Baker, female    DOB: [birth year]  Age: 28 y.o. MRN: [id number]  Chief Complaint  Patient presents with   Annual Exam    HPI  Katrina Baker is a 28 year old female with past medical history of PCOS, migraines, b12 deficiency, HLD, vitamin d  defiency, obesity presents for complete physical and follow up of chronic conditions.  Immunizations: -Tetanus: Completed in 2025 -Influenza: completed this season.  -HPV: Completed  Diet: Fair diet.  Exercise: No regular exercise.  Eye exam: Completes annually  Dental exam: Completes semi-annually    Pap Smear: Completed in 2025  Family history of breast cancer with 2 maternal Aunt; diagnosed in 41s and paternal grandmother had breast cancer and lung cancer. Paternal aunt diagnosed in 52s.  Also history of ovarian cancer but not sure who and what age they were diagnosed.  Anxiety: recently had a panic attack in the last six months. She has mind racing thoughts, overwhelmed and worrying all the time. She does not feel that everyday and uses her coping mechanisms. She is planning to start therapy. She denies SI/HI.    Patient Active Problem List   Diagnosis Date Noted   Encounter for screening and preventative care 05/02/2024   Family history of breast cancer in female 05/02/2024   Family history of diabetes mellitus (DM) 05/02/2024   Anxiety 05/02/2024   Class 1 obesity due to excess calories with serious comorbidity and body mass index (BMI) of 33.0 to 33.9 in adult 10/19/2023   Hyperlipidemia 04/22/2021   Vitamin D  deficiency 04/22/2021   Migraine without status migrainosus, not intractable 11/04/2020   B12 deficiency 04/22/2020   PCOS (polycystic ovarian syndrome) 05/30/2016   Past Medical History:  Diagnosis Date   Allergy    Anxiety 05/02/2024   Asthma    Encounter to establish care 10/19/2023   GERD (gastroesophageal reflux disease)     Hyperlipidemia 04/22/2021   Vasovagal syncope 06/16/2016   Past Surgical History:  Procedure Laterality Date   NO PAST SURGERIES     No Known Allergies       10/19/2023    9:39 AM 04/22/2020    3:31 PM  Depression screen PHQ 2/9  Decreased Interest 1 0  Down, Depressed, Hopeless 0 1  PHQ - 2 Score 1 1  Altered sleeping 1   Tired, decreased energy 1   Change in appetite 1   Feeling bad or failure about yourself  1   Trouble concentrating 0   Moving slowly or fidgety/restless 0   Suicidal thoughts 0   PHQ-9 Score 5    Difficult doing work/chores Somewhat difficult      Data saved with a previous flowsheet row definition       10/19/2023    9:39 AM  GAD 7 : Generalized Anxiety Score  Nervous, Anxious, on Edge 1  Control/stop worrying 1  Worry too much - different things 1  Trouble relaxing 0  Restless 0  Easily annoyed or irritable 1  Afraid - awful might happen 0  Total GAD 7 Score 4  Anxiety Difficulty Somewhat difficult      Review of Systems  Constitutional:  Negative for chills, fever, malaise/fatigue and weight loss.  HENT:  Negative for congestion, ear discharge, ear pain, hearing loss, nosebleeds, sinus pain, sore throat and tinnitus.   Eyes:  Negative for blurred vision, double vision, pain, discharge and redness.  Respiratory:  Negative for  cough, shortness of breath, wheezing and stridor.   Cardiovascular:  Negative for chest pain, palpitations and leg swelling.  Gastrointestinal:  Negative for abdominal pain, constipation, diarrhea, heartburn, nausea and vomiting.  Genitourinary:  Negative for dysuria, frequency and urgency.  Musculoskeletal:  Negative for myalgias.  Skin:  Negative for rash.  Neurological:  Negative for dizziness, tingling, seizures, weakness and headaches.  Psychiatric/Behavioral:  Negative for depression, substance abuse and suicidal ideas. The patient is not nervous/anxious.       Objective:     BP 132/72 (BP Location: Right  Arm, Patient Position: Sitting, Cuff Size: Large)   Pulse (!) 110   Temp 97.9 F (36.6 C) (Temporal)   Ht 5' 5 (1.651 m)   Wt 203 lb 4 oz (92.2 kg)   LMP 04/23/2024   SpO2 98%   BMI 33.82 kg/m  BP Readings from Last 3 Encounters:  05/02/24 132/72  10/19/23 104/80  02/20/23 112/74   Wt Readings from Last 3 Encounters:  05/02/24 203 lb 4 oz (92.2 kg)  10/19/23 204 lb (92.5 kg)  02/20/23 175 lb (79.4 kg)      Physical Exam Vitals and nursing note reviewed.  Constitutional:      Appearance: Normal appearance.  HENT:     Head: Normocephalic and atraumatic.     Right Ear: Tympanic membrane, ear canal and external ear normal.     Left Ear: Tympanic membrane, ear canal and external ear normal.     Nose: Nose normal.     Mouth/Throat:     Mouth: Mucous membranes are moist.     Pharynx: Oropharynx is clear.  Eyes:     Conjunctiva/sclera: Conjunctivae normal.     Pupils: Pupils are equal, round, and reactive to light.  Cardiovascular:     Rate and Rhythm: Normal rate and regular rhythm.     Pulses: Normal pulses.     Heart sounds: Normal heart sounds.  Pulmonary:     Effort: Pulmonary effort is normal.     Breath sounds: Normal breath sounds.  Abdominal:     General: Abdomen is flat. Bowel sounds are normal.     Palpations: Abdomen is soft.  Musculoskeletal:        General: Normal range of motion.     Cervical back: Normal range of motion.  Skin:    General: Skin is warm and dry.     Capillary Refill: Capillary refill takes less than 2 seconds.  Neurological:     General: No focal deficit present.     Mental Status: She is alert and oriented to person, place, and time. Mental status is at baseline.  Psychiatric:        Mood and Affect: Mood normal.        Behavior: Behavior normal.        Thought Content: Thought content normal.        Judgment: Judgment normal.      No results found for any visits on 05/02/24.     The ASCVD Risk score (Arnett DK, et al.,  2019) failed to calculate for the following reasons:   The 2019 ASCVD risk score is only valid for ages 43 to 18    Assessment & Plan:  Encounter for screening and preventative care Assessment & Plan: Immunizations UTD. Pap smear UTD.  Discussed the importance of a healthy diet and regular exercise in order for weight loss, and to reduce the risk of further co-morbidity.  Exam stable. Labs pending.  Follow  up in 1 year for repeat physical.   Orders: -     CBC -     Comprehensive metabolic panel with GFR -     Lipid panel -     TSH  B12 deficiency Assessment & Plan: Vitamin b 12 level pending.  Orders: -     Vitamin B12  Vitamin D  deficiency Assessment & Plan: Vitamin d  level pending.  Orders: -     VITAMIN D  25 Hydroxy (Vit-D Deficiency, Fractures)  Class 1 obesity due to excess calories with serious comorbidity and body mass index (BMI) of 33.0 to 33.9 in adult Assessment & Plan: A1c pending.  Discussed the importance of healthy diet and exercise to affect sustainable weight loss.    Orders: -     Hemoglobin A1c  Family history of breast cancer in female  Family history of diabetes mellitus (DM)  Anxiety Assessment & Plan: Uncontrolled.   Start sertraline 25 mg once daily at bedtime for seven days and then increase to 50 mg thereafter. Rx sent.   F/u in 4 weeks.   Orders: -     Sertraline HCl; Take 1 tablet (50 mg total) by mouth daily.  Dispense: 30 tablet; Refill: 0     Return in about 4 weeks (around 05/30/2024) for anxiety .    Carrol Aurora, NP

## 2024-05-02 NOTE — Assessment & Plan Note (Signed)
 Uncontrolled.   Start sertraline  25 mg once daily at bedtime for seven days and then increase to 50 mg thereafter. Rx sent.   F/u in 4 weeks.

## 2024-05-02 NOTE — Assessment & Plan Note (Signed)
 Immunizations UTD. Pap smear UTD.  Discussed the importance of a healthy diet and regular exercise in order for weight loss, and to reduce the risk of further co-morbidity.  Exam stable. Labs pending.  Follow up in 1 year for repeat physical.

## 2024-05-02 NOTE — Patient Instructions (Addendum)
 Stop by the lab prior to leaving today. I will notify you of your results once received.    Start sertraline 25 mg once daily at bedtime for seven day and then increase to full tablet thereafter. F/u in 4 weeks.   Follow up in one year for physical and fasting labs.   It was a pleasure to see you today!

## 2024-05-02 NOTE — Assessment & Plan Note (Addendum)
 A1c pending.  Discussed the importance of healthy diet and exercise to affect sustainable weight loss.

## 2024-05-03 ENCOUNTER — Ambulatory Visit: Payer: Self-pay | Admitting: General Practice

## 2024-05-03 DIAGNOSIS — E559 Vitamin D deficiency, unspecified: Secondary | ICD-10-CM

## 2024-05-06 ENCOUNTER — Other Ambulatory Visit (HOSPITAL_COMMUNITY): Payer: Self-pay

## 2024-05-06 MED ORDER — VITAMIN D (ERGOCALCIFEROL) 1.25 MG (50000 UNIT) PO CAPS
50000.0000 [IU] | ORAL_CAPSULE | ORAL | 0 refills | Status: AC
  Filled 2024-05-06: qty 12, 84d supply, fill #0

## 2024-05-15 ENCOUNTER — Other Ambulatory Visit (HOSPITAL_COMMUNITY): Payer: Self-pay

## 2024-05-31 ENCOUNTER — Ambulatory Visit: Admitting: General Practice

## 2024-06-28 ENCOUNTER — Telehealth: Admitting: Family

## 2024-06-28 ENCOUNTER — Other Ambulatory Visit (HOSPITAL_COMMUNITY): Payer: Self-pay

## 2024-06-28 DIAGNOSIS — L03011 Cellulitis of right finger: Secondary | ICD-10-CM

## 2024-06-28 MED ORDER — SULFAMETHOXAZOLE-TRIMETHOPRIM 800-160 MG PO TABS: 1.0000 | ORAL_TABLET | Freq: Two times a day (BID) | ORAL | 0 refills | Status: AC

## 2024-06-28 NOTE — Patient Instructions (Signed)
 Paronychia Paronychia is an infection of the skin that surrounds a nail. It usually affects the skin around a fingernail, but it may also occur near a toenail. It often causes pain and swelling around the nail. In some cases, a collection of pus (abscess) can form near or under the nail.  This condition may develop suddenly, or it may develop gradually over a longer period. In most cases, paronychia is not serious, and it will clear up with treatment. What are the causes? This condition may be caused by bacteria or a fungus, such as yeast. The bacteria or fungus can enter the body through an opening in the skin, such as a cut or a hangnail, and cause an infection in your fingernail or toenail. Other causes may include: Recurrent injury to the fingernail or toenail area. Irritation of the base and sides of the nail (cuticle). Injury and irritation can result in inflammation, swelling, and thickened skin around the nail. What increases the risk? This condition is more likely to develop in people who: Get their hands wet often, such as those who work as Fish farm manager, bartenders, or housekeepers. Bite their fingernails or cuticles. Have underlying skin conditions. Have hangnails or injured fingertips. Are exposed to irritants like detergents and other chemicals. Have diabetes. What are the signs or symptoms? Symptoms of this condition include: Redness and swelling of the skin near the nail. Tenderness around the nail when you touch the area. Pus-filled bumps under the cuticle. Fluid or pus under the nail. Throbbing pain in the area. How is this diagnosed? This condition is diagnosed with a physical exam. In some cases, a sample of pus may be tested to determine what type of bacteria or fungus is causing the condition. How is this treated? Treatment depends on the cause and severity of your condition. If your condition is mild, it may clear up on its own in a few days or after soaking in warm  water. If needed, treatment may include: Antibiotic medicine, if your infection is caused by bacteria. Antifungal medicine, if your infection is caused by a fungus. A procedure to drain pus from an abscess. Anti-inflammatory medicine (corticosteroids). Removal of part of an ingrown toenail. A bandage (dressing) may be placed over the affected area if an abscess or part of a nail has been removed. Follow these instructions at home: Wound care Keep the affected area clean. Soak the affected area in warm water if told to do so by your health care provider. You may be told to do this for 20 minutes, 2-3 times a day. Keep the area dry when you are not soaking it. Do not try to drain an abscess yourself. Follow instructions from your health care provider about how to take care of the affected area. Make sure you: Wash your hands with soap and water for at least 20 seconds before and after you change your dressing. If soap and water are not available, use hand sanitizer. Change your dressing as told by your health care provider. If you had an abscess drained, check the area every day for signs of infection. Check for: Redness, swelling, or pain. Fluid or blood. Warmth. Pus or a bad smell. Medicines  Take over-the-counter and prescription medicines only as told by your health care provider. If you were prescribed an antibiotic medicine, take it as told by your health care provider. Do not stop taking the antibiotic even if you start to feel better. General instructions Avoid contact with any skin irritants or allergens.  Do not pick at the affected area. Keep all follow-up visits as told. This is important. Prevention To prevent this condition from happening again: Wear rubber gloves when washing dishes or doing other tasks that require your hands to get wet. Wear gloves if your hands might come in contact with cleaners or other chemicals. Avoid injuring your nails or fingertips. Do not bite  your nails or tear hangnails. Do not cut your nails very short. Do not cut your cuticles. Use clean nail clippers or scissors when trimming nails. Contact a health care provider if: Your symptoms get worse or do not improve with treatment. You have continued or increased fluid, blood, or pus coming from the affected area. Your affected finger, toe, or joint becomes swollen or difficult to move. You have a fever or chills. There is redness spreading away from the affected area. Summary Paronychia is an infection of the skin that surrounds a nail. It often causes pain and swelling around the nail. In some cases, a collection of pus (abscess) can form near or under the nail. This condition may be caused by bacteria or a fungus. These germs can enter the body through an opening in the skin, such as a cut or a hangnail. If your condition is mild, it may clear up on its own in a few days. If needed, treatment may include medicine or a procedure to drain pus from an abscess. To prevent this condition from happening again, wear gloves if doing tasks that require your hands to get wet or to come in contact with chemicals. Also avoid injuring your nails or fingertips. This information is not intended to replace advice given to you by your health care provider. Make sure you discuss any questions you have with your health care provider. Document Revised: 08/31/2020 Document Reviewed: 08/31/2020 Elsevier Patient Education  2024 [employer].

## 2024-06-28 NOTE — Progress Notes (Signed)
 " Virtual Visit Consent   Katrina Baker, you are scheduled for a virtual visit with a [facility] provider today. Just as with appointments in the office, your consent must be obtained to participate. Your consent will be active for this visit and any virtual visit you may have with one of our providers in the next 365 days. If you have a MyChart account, a copy of this consent can be sent to you electronically.  As this is a virtual visit, video technology does not allow for your provider to perform a traditional examination. This may limit your provider's ability to fully assess your condition. If your provider identifies any concerns that need to be evaluated in person or the need to arrange testing (such as labs, EKG, etc.), we will make arrangements to do so. Although advances in technology are sophisticated, we cannot ensure that it will always work on either your end or our end. If the connection with a video visit is poor, the visit may have to be switched to a telephone visit. With either a video or telephone visit, we are not always able to ensure that we have a secure connection.  By engaging in this virtual visit, you consent to the provision of healthcare and authorize for your insurance to be billed (if applicable) for the services provided during this visit. Depending on your insurance coverage, you may receive a charge related to this service.  I need to obtain your verbal consent now. Are you willing to proceed with your visit today? Katrina Baker has provided verbal consent on 06/28/2024 for a virtual visit (video or telephone). Bari Learn, FNP  Date: 06/28/2024 6:54 PM   Virtual Visit via Video Note   I, Bari Learn, connected with  Katrina Baker  ([id number], 1995/07/13) on 06/28/24 at  7:00 PM EST by a video-enabled telemedicine application and verified that I am speaking with the correct person using two identifiers.  Location: Patient: Virtual Visit Location  Patient: Home Provider: Virtual Visit Location Provider: Home Office   I discussed the limitations of evaluation and management by telemedicine and the availability of in person appointments. The patient expressed understanding and agreed to proceed.    History of Present Illness: Katrina Baker is a 29 y.o. who identifies as a female who was assigned female at birth, and is being seen today for swelling and tenderness of right pinky that she noticed two days. Reports yesterday the swelling increased and today has become increasing erythemas and tender. Denies any discharge or fevers. Has soaked area.   HPI: HPI  Problems:  Patient Active Problem List   Diagnosis Date Noted   Encounter for screening and preventative care 05/02/2024   Family history of breast cancer in female 05/02/2024   Family history of diabetes mellitus (DM) 05/02/2024   Anxiety 05/02/2024   Class 1 obesity due to excess calories with serious comorbidity and body mass index (BMI) of 33.0 to 33.9 in adult 10/19/2023   Hyperlipidemia 04/22/2021   Vitamin D  deficiency 04/22/2021   Migraine without status migrainosus, not intractable 11/04/2020   B12 deficiency 04/22/2020   PCOS (polycystic ovarian syndrome) 05/30/2016    Allergies: Allergies[1] Medications: Current Medications[2]  Observations/Objective: Patient is well-developed, well-nourished in no acute distress.  Resting comfortably  at home.  Head is normocephalic, atraumatic.  No labored breathing.  Speech is clear and coherent with logical content.  Patient is alert and oriented at baseline.  Right pinky erythemas and swollen, tenderness  noted  Assessment and Plan: 1. Paronychia of finger of right hand (Primary) - sulfamethoxazole -trimethoprim  (BACTRIM  DS) 800-160 MG tablet; Take 1 tablet by mouth 2 (two) times daily.  Dispense: 14 tablet; Refill: 0  Keep clean and dry Soak finger Tylenol as needed  Report any worsening symptoms of fever, pain, or  redness spreading. Will need to be seen face to face   Follow Up Instructions: I discussed the assessment and treatment plan with the patient. The patient was provided an opportunity to ask questions and all were answered. The patient agreed with the plan and demonstrated an understanding of the instructions.  A copy of instructions were sent to the patient via MyChart unless otherwise noted below.     The patient was advised to call back or seek an in-person evaluation if the symptoms worsen or if the condition fails to improve as anticipated.    Bari Learn, FNP    [1] No Known Allergies [2]  Current Outpatient Medications:    sulfamethoxazole -trimethoprim  (BACTRIM  DS) 800-160 MG tablet, Take 1 tablet by mouth 2 (two) times daily., Disp: 14 tablet, Rfl: 0   cholecalciferol (VITAMIN D3) 25 MCG (1000 UNIT) tablet, Take 1,000 Units by mouth daily., Disp: , Rfl:    cyanocobalamin  1000 MCG tablet, Vitamin B-12  1,000 mcg tablet  Take by oral route., Disp: , Rfl:    naproxen (NAPROSYN) 500 MG tablet, Take 500 mg by mouth daily as needed., Disp: , Rfl:    rizatriptan  (MAXALT ) 10 MG tablet, Take 1 tablet (10 mg total) by mouth as needed for migraine. May repeat in 2 hours if needed, Disp: 10 tablet, Rfl: 1   sertraline  (ZOLOFT ) 50 MG tablet, Take 1 tablet (50 mg total) by mouth daily., Disp: 30 tablet, Rfl: 0   Vitamin D , Ergocalciferol , (DRISDOL ) 1.25 MG (50000 UNIT) CAPS capsule, Take 1 capsule (50,000 Units total) by mouth every 7 (seven) days., Disp: 12 capsule, Rfl: 0   XULANE  150-35 MCG/24HR transdermal patch, APPLY 1 PATCH TO SKIN ONCE WEEKLY AS DIRECTED, Disp: 9 patch, Rfl: 3  "

## 2024-07-01 ENCOUNTER — Other Ambulatory Visit: Payer: Self-pay

## 2024-07-01 ENCOUNTER — Other Ambulatory Visit (HOSPITAL_COMMUNITY): Payer: Self-pay

## 2024-07-01 MED ORDER — XULANE 150-35 MCG/24HR TD PTWK
1.0000 | MEDICATED_PATCH | TRANSDERMAL | 0 refills | Status: AC
  Filled 2024-07-01: qty 3, 21d supply, fill #0

## 2024-07-06 ENCOUNTER — Other Ambulatory Visit (HOSPITAL_COMMUNITY): Payer: Self-pay

## 2024-07-11 ENCOUNTER — Other Ambulatory Visit: Payer: Self-pay

## 2024-07-11 ENCOUNTER — Other Ambulatory Visit (HOSPITAL_COMMUNITY): Payer: Self-pay

## 2024-07-11 MED ORDER — XULANE 150-35 MCG/24HR TD PTWK
1.0000 | MEDICATED_PATCH | TRANSDERMAL | 3 refills | Status: AC
  Filled 2024-07-11: qty 9, 84d supply, fill #0

## 2024-07-12 ENCOUNTER — Other Ambulatory Visit: Payer: Self-pay

## 2024-07-14 ENCOUNTER — Other Ambulatory Visit (HOSPITAL_BASED_OUTPATIENT_CLINIC_OR_DEPARTMENT_OTHER): Payer: Self-pay

## 2025-05-06 ENCOUNTER — Encounter: Admitting: General Practice
